# Patient Record
Sex: Female | Born: 1960 | Race: White | Hispanic: No | Marital: Single | Smoking: Former smoker
Health system: Southern US, Community
[De-identification: ages and names within clinical notes are randomized; demographics above are authoritative.]

## PROBLEM LIST (undated history)

## (undated) DIAGNOSIS — C539 Malignant neoplasm of cervix uteri, unspecified: Secondary | ICD-10-CM

## (undated) DIAGNOSIS — G2581 Restless legs syndrome: Secondary | ICD-10-CM

## (undated) DIAGNOSIS — T8859XA Other complications of anesthesia, initial encounter: Secondary | ICD-10-CM

## (undated) DIAGNOSIS — K219 Gastro-esophageal reflux disease without esophagitis: Secondary | ICD-10-CM

## (undated) DIAGNOSIS — T4145XA Adverse effect of unspecified anesthetic, initial encounter: Secondary | ICD-10-CM

## (undated) HISTORY — DX: Gastro-esophageal reflux disease without esophagitis: K21.9

## (undated) HISTORY — PX: COLONOSCOPY WITH ESOPHAGOGASTRODUODENOSCOPY (EGD) AND ESOPHAGEAL DILATION (ED): SHX6495

---
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
## 2001-04-18 DIAGNOSIS — C539 Malignant neoplasm of cervix uteri, unspecified: Secondary | ICD-10-CM

## 2001-04-18 HISTORY — PX: ABDOMINAL HYSTERECTOMY: SHX81

## 2001-04-18 HISTORY — DX: Malignant neoplasm of cervix uteri, unspecified: C53.9

## 2008-11-18 ENCOUNTER — Ambulatory Visit: Payer: Self-pay

## 2010-02-03 ENCOUNTER — Ambulatory Visit: Payer: Self-pay

## 2011-02-18 ENCOUNTER — Ambulatory Visit: Payer: Self-pay

## 2012-01-27 ENCOUNTER — Ambulatory Visit: Payer: Self-pay | Admitting: Gastroenterology

## 2012-03-07 ENCOUNTER — Ambulatory Visit: Payer: Self-pay

## 2013-01-16 LAB — HM PAP SMEAR: HM PAP: NORMAL

## 2013-03-08 ENCOUNTER — Ambulatory Visit: Payer: Self-pay

## 2014-01-16 LAB — HM COLONOSCOPY

## 2014-02-16 LAB — HM MAMMOGRAPHY: HM Mammogram: NORMAL

## 2014-02-25 DIAGNOSIS — R1013 Epigastric pain: Secondary | ICD-10-CM | POA: Insufficient documentation

## 2014-03-07 ENCOUNTER — Ambulatory Visit: Payer: Self-pay | Admitting: Gastroenterology

## 2014-03-18 ENCOUNTER — Ambulatory Visit: Payer: Self-pay

## 2014-08-11 LAB — SURGICAL PATHOLOGY

## 2014-12-18 ENCOUNTER — Ambulatory Visit (INDEPENDENT_AMBULATORY_CARE_PROVIDER_SITE_OTHER): Payer: Managed Care, Other (non HMO) | Admitting: Obstetrics and Gynecology

## 2014-12-18 ENCOUNTER — Encounter: Payer: Self-pay | Admitting: Obstetrics and Gynecology

## 2014-12-18 ENCOUNTER — Other Ambulatory Visit: Payer: Self-pay | Admitting: Obstetrics and Gynecology

## 2014-12-18 ENCOUNTER — Telehealth: Payer: Self-pay | Admitting: Obstetrics and Gynecology

## 2014-12-18 VITALS — BP 116/75 | HR 72 | Ht 62.0 in | Wt 211.3 lb

## 2014-12-18 DIAGNOSIS — Z0000 Encounter for general adult medical examination without abnormal findings: Secondary | ICD-10-CM

## 2014-12-18 DIAGNOSIS — E669 Obesity, unspecified: Secondary | ICD-10-CM

## 2014-12-18 MED ORDER — PHENTERMINE HCL 37.5 MG PO TABS: 37.5000 mg | ORAL_TABLET | Freq: Every day | ORAL | Status: DC

## 2014-12-18 MED ORDER — CYANOCOBALAMIN 1000 MCG/ML IJ SOLN: 1000.0000 ug | Freq: Once | INTRAMUSCULAR | Status: DC

## 2014-12-18 NOTE — Progress Notes (Signed)
Patient ID: Kristin Brown, female   DOB: Dec 14, 1960, 54 y.o.   MRN: [id number] Subjective:  Kristin Brown is a 54 y.o. No obstetric history on file. at Unknown being seen today for weight loss management- initial visit.  Patient reports General ROS: negative and reports previous weight loss attempts with exercise only successful.  Onset was gradual over 2 year(s) ago.  Previous/Current treatment includes: small frequent feedings, nutritional supplement, vitamin supplement, and exercise with walking.  Pertinent medical history includes: chronic digestive disease, diabetes, eating disorder, anxiety and psychiatric illness.   The patient has a surgical history of: hysterectomy.   The following portions of the patient's history were reviewed and updated as appropriate: allergies, current medications, past family history, past medical history, past social history, past surgical history and problem list.   Objective:   Filed Vitals:   12/18/14 1407  BP: 116/75  Pulse: 72  Height: 5\' 2"  (1.575 m)  Weight: 211 lb 4.8 oz (95.845 kg)    General:  Alert, oriented and cooperative. Patient is in no acute distress.  :   :   :   :   :   :   PE: Well groomed female in no current distress,   Mental Status: Normal mood and affect. Normal behavior. Normal judgment and thought content.   Current BMI: Body mass index is 38.64 kg/(m^2).   Assessment and Plan:  Obesity  1. Obesity Secondary to overeating and sedentary lifestyle   Plan: low carb, High protein diet RX for adipex 37.5 mg daily and B12 1072mcg.ml monthly, to start now with first injection given at today's visit. Reviewed side-effects common to both medications and expected outcomes. Increase daily water intake to at least 8 bottle a day, every day.  Goal is to reduse weight by 10% by end of three months, and will re-evaluate then.  RTC in 4 weeks for Nurse visit to check weight & BP, and get next B12  injections.    Please refer to After Visit Summary for other counseling recommendations.    Salik Grewell Valene Bors, CNM   Olena Willy Valene Bors, CNM      Consider the Low Glycemic Index Diet and 6 smaller meals daily .  This boosts your metabolism and regulates your sugars:   Use the protein bar by Atkins because they have lots of fiber in them  Find the low carb flatbreads, tortillas and pita breads for sandwiches:  Joseph's makes a pita bread and a flat bread , available at [facility] and [employer]; Charles Mix makes a low carb flatbread available at [employer] and HT that is 9 net carbs and 100 cal Mission makes a low carb whole wheat tortilla available at [employer] most grocery stores with 6 net carbs and 210 cal  [place] yogurt can still have a lot of carbs .  Dannon Light N fit has 80 cal and 8 carbs

## 2014-12-18 NOTE — Patient Instructions (Signed)
Thank you for enrolling in [facility]. Please follow the instructions below to securely access your online medical record. MyChart allows you to send messages to your doctor, view your test results, renew your prescriptions, schedule appointments, and more.  How Do I Sign Up? 1. In your Internet browser, go to [email or web address]. 2. Click on the New  User? link in the Sign In box.  3. Enter your MyChart Access Code exactly as it appears below. You will not need to use this code after you have completed the sign-up process. If you do not sign up before the expiration date, you must request a new code. MyChart Access Code: [email or web address] Expires: 02/16/2015  2:45 PM  4. Enter the last four digits of your Social Security Number (xxxx) and Date of Birth (mm/dd/yyyy) as indicated and click Next. You will be taken to the next sign-up page. 5. Create a MyChart ID. This will be your MyChart login ID and cannot be changed, so think of one that is secure and easy to remember. 6. Create a MyChart password. You can change your password at any time. 7. Enter your Password Reset Question and Answer and click Next. This can be used at a later time if you forget your password.  8. Select your communication preference, and if applicable enter your e-mail address. You will receive e-mail notification when new information is available in MyChart by choosing to receive e-mail notifications and filling in your e-mail. 9. Click Sign In. You can now view your medical record.   Additional Information If you have questions, you can email [email or web address] or call [phone] to talk to our [facility] staff. Remember, MyChart is NOT to be used for urgent needs. For medical emergencies, dial 911. Obesity Obesity is defined as having too much total body fat and a body mass index (BMI) of 30 or more. BMI is an estimate of body fat and is calculated from your height and weight. Obesity happens  when you consume more calories than you can burn by exercising or performing daily physical tasks. Prolonged obesity can cause major illnesses or emergencies, such as:   Stroke.  Heart disease.  Diabetes.  Cancer.  Arthritis.  High blood pressure (hypertension).  High cholesterol.  Sleep apnea.  Erectile dysfunction.  Infertility problems. CAUSES   Regularly eating unhealthy foods.  Physical inactivity.  Certain disorders, such as an underactive thyroid (hypothyroidism), Cushing's syndrome, and polycystic ovarian syndrome.  Certain medicines, such as steroids, some depression medicines, and antipsychotics.  Genetics.  Lack of sleep. DIAGNOSIS  A health care provider can diagnose obesity after calculating your BMI. Obesity will be diagnosed if your BMI is 30 or higher.  There are other methods of measuring obesity levels. Some other methods include measuring your skinfold thickness, your waist circumference, and comparing your hip circumference to your waist circumference. TREATMENT  A healthy treatment program includes some or all of the following:  Long-term dietary changes.  Exercise and physical activity.  Behavioral and lifestyle changes.  Medicine only under the supervision of your health care provider. Medicines may help, but only if they are used with diet and exercise programs. An unhealthy treatment program includes:  Fasting.  Fad diets.  Supplements and drugs. These choices do not succeed in long-term weight control.  HOME CARE INSTRUCTIONS   Exercise and perform physical activity as directed by your health care provider. To increase physical activity, try the following:  Use stairs instead of elevators.  Park farther  away from store entrances.  Garden, bike, or walk instead of watching television or using the computer.  Eat healthy, low-calorie foods and drinks on a regular basis. Eat more fruits and vegetables. Use low-calorie cookbooks or  take healthy cooking classes.  Limit fast food, sweets, and processed snack foods.  Eat smaller portions.  Keep a daily journal of everything you eat. There are many free websites to help you with this. It may be helpful to measure your foods so you can determine if you are eating the correct portion sizes.  Avoid drinking alcohol. Drink more water and drinks without calories.  Take vitamins and supplements only as recommended by your health care provider.  Weight-loss support groups, Tax adviser, counselors, and stress reduction education can also be very helpful. SEEK IMMEDIATE MEDICAL CARE IF:  You have chest pain or tightness.  You have trouble breathing or feel short of breath.  You have weakness or leg numbness.  You feel confused or have trouble talking.  You have sudden changes in your vision. MAKE SURE YOU:  Understand these instructions.  Will watch your condition.  Will get help right away if you are not doing well or get worse. Document Released: 05/12/2004 Document Revised: 08/19/2013 Document Reviewed: 05/11/2011 [facility] Patient Information 2015 [place], [place]. This information is not intended to replace advice given to you by your health care provider. Make sure you discuss any questions you have with your health care provider.  Exercise to Lose Weight Exercise and a healthy diet may help you lose weight. Your doctor may suggest specific exercises. EXERCISE IDEAS AND TIPS  Choose low-cost things you enjoy doing, such as walking, bicycling, or exercising to workout videos.  Take stairs instead of the elevator.  Walk during your lunch break.  Park your car further away from work or school.  Go to a gym or an exercise class.  Start with 5 to 10 minutes of exercise each day. Build up to 30 minutes of exercise 4 to 6 days a week.  Wear shoes with good support and comfortable clothes.  Stretch before and after working out.  Work out until you  breathe harder and your heart beats faster.  Drink extra water when you exercise.  Do not do so much that you hurt yourself, feel dizzy, or get very short of breath. Exercises that burn about 150 calories:  Running 1  miles in 15 minutes.  Playing volleyball for 45 to 60 minutes.  Washing and waxing a car for 45 to 60 minutes.  Playing touch football for 45 minutes.  Walking 1  miles in 35 minutes.  Pushing a stroller 1  miles in 30 minutes.  Playing basketball for 30 minutes.  Raking leaves for 30 minutes.  Bicycling 5 miles in 30 minutes.  Walking 2 miles in 30 minutes.  Dancing for 30 minutes.  Shoveling snow for 15 minutes.  Swimming laps for 20 minutes.  Walking up stairs for 15 minutes.  Bicycling 4 miles in 15 minutes.  Gardening for 30 to 45 minutes.  Jumping rope for 15 minutes.  Washing windows or floors for 45 to 60 minutes. Document Released: 05/07/2010 Document Revised: 06/27/2011 Document Reviewed: 05/07/2010 [facility] Patient Information 2015 [place], [place]. This information is not intended to replace advice given to you by your health care provider. Make sure you discuss any questions you have with your health care provider. Calorie Counting for Weight Loss Calories are energy you get from the things you eat and drink.  Your body uses this energy to keep you going throughout the day. The number of calories you eat affects your weight. When you eat more calories than your body needs, your body stores the extra calories as fat. When you eat fewer calories than your body needs, your body burns fat to get the energy it needs. Calorie counting means keeping track of how many calories you eat and drink each day. If you make sure to eat fewer calories than your body needs, you should lose weight. In order for calorie counting to work, you will need to eat the number of calories that are right for you in a day to lose a healthy amount of weight per week. A  healthy amount of weight to lose per week is usually 1-2 lb (0.5-0.9 kg). A dietitian can determine how many calories you need in a day and give you suggestions on how to reach your calorie goal.  WHAT IS MY MY PLAN? My goal is to have __________ calories per day.  If I have this many calories per day, I should lose around __________ pounds per week. WHAT DO I NEED TO KNOW ABOUT CALORIE COUNTING? In order to meet your daily calorie goal, you will need to:  Find out how many calories are in each food you would like to eat. Try to do this before you eat.  Decide how much of the food you can eat.  Write down what you ate and how many calories it had. Doing this is called keeping a food log. WHERE DO I FIND CALORIE INFORMATION? The number of calories in a food can be found on a Nutrition Facts label. Note that all the information on a label is based on a specific serving of the food. If a food does not have a Nutrition Facts label, try to look up the calories online or ask your dietitian for help. HOW DO I DECIDE HOW MUCH TO EAT? To decide how much of the food you can eat, you will need to consider both the number of calories in one serving and the size of one serving. This information can be found on the Nutrition Facts label. If a food does not have a Nutrition Facts label, look up the information online or ask your dietitian for help. Remember that calories are listed per serving. If you choose to have more than one serving of a food, you will have to multiply the calories per serving by the amount of servings you plan to eat. For example, the label on a package of bread might say that a serving size is 1 slice and that there are 90 calories in a serving. If you eat 1 slice, you will have eaten 90 calories. If you eat 2 slices, you will have eaten 180 calories. HOW DO I KEEP A FOOD LOG? After each meal, record the following information in your food log:  What you ate.  How much of it you  ate.  How many calories it had.  Then, add up your calories. Keep your food log near you, such as in a small notebook in your pocket. Another option is to use a mobile app or website. Some programs will calculate calories for you and show you how many calories you have left each time you add an item to the log. WHAT ARE SOME CALORIE COUNTING TIPS?  Use your calories on foods and drinks that will fill you up and not leave you hungry. Some examples of this  include foods like nuts and nut butters, vegetables, lean proteins, and high-fiber foods (more than 5 g fiber per serving).  Eat nutritious foods and avoid empty calories. Empty calories are calories you get from foods or beverages that do not have many nutrients, such as candy and soda. It is better to have a nutritious high-calorie food (such as an avocado) than a food with few nutrients (such as a bag of chips).  Know how many calories are in the foods you eat most often. This way, you do not have to look up how many calories they have each time you eat them.  Look out for foods that may seem like low-calorie foods but are really high-calorie foods, such as baked goods, soda, and fat-free candy.  Pay attention to calories in drinks. Drinks such as sodas, specialty coffee drinks, alcohol, and juices have a lot of calories yet do not fill you up. Choose low-calorie drinks like water and diet drinks.  Focus your calorie counting efforts on higher calorie items. Logging the calories in a garden salad that contains only vegetables is less important than calculating the calories in a milk shake.  Find a way of tracking calories that works for you. Get creative. Most people who are successful find ways to keep track of how much they eat in a day, even if they do not count every calorie. WHAT ARE SOME PORTION CONTROL TIPS?  Know how many calories are in a serving. This will help you know how many servings of a certain food you can have.  Use a  measuring cup to measure serving sizes. This is helpful when you start out. With time, you will be able to estimate serving sizes for some foods.  Take some time to put servings of different foods on your favorite plates, bowls, and cups so you know what a serving looks like.  Try not to eat straight from a bag or box. Doing this can lead to overeating. Put the amount you would like to eat in a cup or on a plate to make sure you are eating the right portion.  Use smaller plates, glasses, and bowls to prevent overeating. This is a quick and easy way to practice portion control. If your plate is smaller, less food can fit on it.  Try not to multitask while eating, such as watching TV or using your computer. If it is time to eat, sit down at a table and enjoy your food. Doing this will help you to start recognizing when you are full. It will also make you more aware of what and how much you are eating. HOW CAN I CALORIE COUNT WHEN EATING OUT?  Ask for smaller portion sizes or child-sized portions.  Consider sharing an entree and sides instead of getting your own entree.  If you get your own entree, eat only half. Ask for a box at the beginning of your meal and put the rest of your entree in it so you are not tempted to eat it.  Look for the calories on the menu. If calories are listed, choose the lower calorie options.  Choose dishes that include vegetables, fruits, whole grains, low-fat dairy products, and lean protein. Focusing on smart food choices from each of the 5 food groups can help you stay on track at restaurants.  Choose items that are boiled, broiled, grilled, or steamed.  Choose water, milk, unsweetened iced tea, or other drinks without added sugars. If you want an alcoholic beverage,  choose a lower calorie option. For example, a regular margarita can have up to 700 calories and a glass of wine has around 150.  Stay away from items that are buttered, battered, fried, or served with  cream sauce. Items labeled "crispy" are usually fried, unless stated otherwise.  Ask for dressings, sauces, and syrups on the side. These are usually very high in calories, so do not eat much of them.  Watch out for salads. Many people think salads are a healthy option, but this is often not the case. Many salads come with bacon, fried chicken, lots of cheese, fried chips, and dressing. All of these items have a lot of calories. If you want a salad, choose a garden salad and ask for grilled meats or steak. Ask for the dressing on the side, or ask for olive oil and vinegar or lemon to use as dressing.  Estimate how many servings of a food you are given. For example, a serving of cooked rice is  cup or about the size of half a tennis ball or one cupcake wrapper. Knowing serving sizes will help you be aware of how much food you are eating at restaurants. The list below tells you how big or small some common portion sizes are based on everyday objects.  1 oz--4 stacked dice.  3 oz--1 deck of cards.  1 tsp--1 dice.  1 Tbsp-- a Ping-Pong ball.  2 Tbsp--1 Ping-Pong ball.   cup--1 tennis ball or 1 cupcake wrapper.  1 cup--1 baseball. Document Released: 04/04/2005 Document Revised: 08/19/2013 Document Reviewed: 02/07/2013 [facility] Patient Information 2015 [place], [place]. This information is not intended to replace advice given to you by your health care provider. Make sure you discuss any questions you have with your health care provider.

## 2014-12-18 NOTE — Telephone Encounter (Signed)
done

## 2014-12-18 NOTE — Telephone Encounter (Signed)
Can you put in a mammogram appt for this pt to go to [place]. She wants to call and schedule for November.

## 2015-01-15 ENCOUNTER — Ambulatory Visit (INDEPENDENT_AMBULATORY_CARE_PROVIDER_SITE_OTHER): Payer: Managed Care, Other (non HMO) | Admitting: Obstetrics and Gynecology

## 2015-01-15 VITALS — BP 107/67 | HR 83 | Wt 198.7 lb

## 2015-01-15 DIAGNOSIS — E669 Obesity, unspecified: Secondary | ICD-10-CM

## 2015-01-15 MED ORDER — CYANOCOBALAMIN 1000 MCG/ML IJ SOLN
1000.0000 ug | Freq: Once | INTRAMUSCULAR | Status: AC
  Administered 2015-01-15: 1000 ug via INTRAMUSCULAR

## 2015-01-15 NOTE — Progress Notes (Cosign Needed)
Pt is here for wt, bp check, b-12 inj She has done excellent with her weight loss, denies any s/e  12/18/14 wt 211 01/14/15 wt- 198.7

## 2015-02-12 ENCOUNTER — Ambulatory Visit (INDEPENDENT_AMBULATORY_CARE_PROVIDER_SITE_OTHER): Payer: Managed Care, Other (non HMO) | Admitting: Obstetrics and Gynecology

## 2015-02-12 VITALS — BP 115/78 | HR 71 | Ht 62.0 in | Wt 192.2 lb

## 2015-02-12 DIAGNOSIS — E669 Obesity, unspecified: Secondary | ICD-10-CM | POA: Diagnosis not present

## 2015-02-12 MED ORDER — CYANOCOBALAMIN 1000 MCG/ML IJ SOLN
1000.0000 ug | Freq: Once | INTRAMUSCULAR | Status: AC
  Administered 2015-02-12: 1000 ug via INTRAMUSCULAR

## 2015-02-12 NOTE — Progress Notes (Cosign Needed)
Patient ID: Kristin Brown, female   DOB: [birth year], 54 y.o.   MRN: [id number] Pt presents for weight, B/P, B-12 injection. No side effects of medication-Phentermine, or B-12.  Weight loss/gain of  6  lbs. Encouraged eating healthy and exercise.

## 2015-03-04 ENCOUNTER — Ambulatory Visit (INDEPENDENT_AMBULATORY_CARE_PROVIDER_SITE_OTHER): Payer: Managed Care, Other (non HMO) | Admitting: Obstetrics and Gynecology

## 2015-03-04 ENCOUNTER — Encounter: Payer: Self-pay | Admitting: Obstetrics and Gynecology

## 2015-03-04 VITALS — BP 113/77 | HR 66 | Ht 62.0 in | Wt 189.1 lb

## 2015-03-04 DIAGNOSIS — Z8543 Personal history of malignant neoplasm of ovary: Secondary | ICD-10-CM | POA: Diagnosis not present

## 2015-03-04 DIAGNOSIS — Z01419 Encounter for gynecological examination (general) (routine) without abnormal findings: Secondary | ICD-10-CM | POA: Diagnosis not present

## 2015-03-04 NOTE — Progress Notes (Signed)
Patient ID: Kristin Brown, female   DOB: February 16, 1961, 54 y.o.   MRN: [id number]  Subjective:   Kristin Brown is a 54 y.o. No obstetric history on file. Caucasian female here for a routine well-woman exam.  No LMP recorded. Patient has had a hysterectomy.    Current complaints: none PCP: unknown       Does need &  desire labs  Social History: Sexual: heterosexual Marital Status: married Living situation: with spouse Occupation: works at [employer] Tobacco/alcohol: no tobacco use Illicit drugs: no history of illicit drug use  The following portions of the patient's history were reviewed and updated as appropriate: allergies, current medications, past family history, past medical history, past social history, past surgical history and problem list.  Past Medical History Past Medical History  Diagnosis Date  . GERD (gastroesophageal reflux disease)     Past Surgical History Past Surgical History  Procedure Laterality Date  . Abdominal hysterectomy  2003    Gynecologic History No obstetric history on file.  No LMP recorded. Patient has had a hysterectomy. Contraception: status post hysterectomy Last Pap: 2014. Results were: normal Last mammogram: 2015. Results were: normal   Obstetric History OB History  No data available    Current Medications Current Outpatient Prescriptions on File Prior to Visit  Medication Sig Dispense Refill  . cyanocobalamin (,VITAMIN B-12,) 1000 MCG/ML injection Inject 1 mL (1,000 mcg total) into the muscle once. 10 mL 1  . pantoprazole (PROTONIX) 40 MG tablet Take 40 mg by mouth daily.    . phentermine (ADIPEX-P) 37.5 MG tablet Take 1 tablet (37.5 mg total) by mouth daily before breakfast. 30 tablet 2   No current facility-administered medications on file prior to visit.    Review of Systems Patient denies any headaches, blurred vision, shortness of breath, chest pain, abdominal pain, problems with bowel movements, urination, or  intercourse.  Objective:  BP 113/77 mmHg  Pulse 66  Ht 5\' 2"  (1.575 m)  Wt 189 lb 2 oz (85.787 kg)  BMI 34.58 kg/m2 Physical Exam  General:  Well developed, well nourished, no acute distress. She is alert and oriented x3. Skin:  Warm and dry Neck:  Midline trachea, no thyromegaly or nodules Cardiovascular: Regular rate and rhythm, no murmur heard Lungs:  Effort normal, all lung fields clear to auscultation bilaterally Breasts:  No dominant palpable mass, retraction, or nipple discharge Abdomen:  Soft, non tender, no hepatosplenomegaly or masses Pelvic:  External genitalia is normal in appearance.  The vagina is normal in appearance. The cervix is surgically absent.  Thin prep pap is not done   No adnexal masses or tenderness noted. Extremities:  No swelling or varicosities noted Psych:  She has a normal mood and affect  Assessment:   Healthy well-woman exam; obesity on weight loss medications and doing well; H/O ovarian cancer- desires MyRisk  Plan:  Labs obtained today F/U 1 year for AE, or sooner if needed Mammogram scheduled Colonoscopy scheduled already  Kristin Brown, CNM

## 2015-03-04 NOTE — Patient Instructions (Signed)
Place annual gynecologic exam patient instructions here.

## 2015-03-05 LAB — COMPREHENSIVE METABOLIC PANEL
ALT: 11 IU/L (ref 0–32)
AST: 16 IU/L (ref 0–40)
Albumin/Globulin Ratio: 1.6 (ref 1.1–2.5)
Albumin: 4.5 g/dL (ref 3.5–5.5)
Alkaline Phosphatase: 47 IU/L (ref 39–117)
BUN/Creatinine Ratio: 10 (ref 9–23)
BUN: 8 mg/dL (ref 6–24)
Bilirubin Total: 0.3 mg/dL (ref 0.0–1.2)
CO2: 26 mmol/L (ref 18–29)
Calcium: 9.9 mg/dL (ref 8.7–10.2)
Chloride: 99 mmol/L (ref 97–106)
Creatinine, Ser: 0.79 mg/dL (ref 0.57–1.00)
GFR calc Af Amer: 99 mL/min/{1.73_m2} (ref >59)
GFR calc non Af Amer: 86 mL/min/{1.73_m2} (ref >59)
Globulin, Total: 2.8 g/dL (ref 1.5–4.5)
Glucose: 93 mg/dL (ref 65–99)
Potassium: 4.1 mmol/L (ref 3.5–5.2)
Sodium: 139 mmol/L (ref 136–144)
Total Protein: 7.3 g/dL (ref 6.0–8.5)

## 2015-03-05 LAB — HEMOGLOBIN A1C
Est. average glucose Bld gHb Est-mCnc: 114 mg/dL
Hgb A1c MFr Bld: 5.6 % (ref 4.8–5.6)

## 2015-03-05 LAB — LIPID PANEL
Chol/HDL Ratio: 3.3 ratio units (ref 0.0–4.4)
Cholesterol, Total: 194 mg/dL (ref 100–199)
HDL: 58 mg/dL (ref >39)
LDL Calculated: 103 mg/dL — ABNORMAL HIGH (ref 0–99)
TRIGLYCERIDES: 163 mg/dL — AB (ref 0–149)
VLDL CHOLESTEROL CAL: 33 mg/dL (ref 5–40)

## 2015-03-05 LAB — TSH: TSH: 1.61 u[IU]/mL (ref 0.450–4.500)

## 2015-03-11 ENCOUNTER — Ambulatory Visit: Payer: Managed Care, Other (non HMO)

## 2015-03-17 ENCOUNTER — Ambulatory Visit (INDEPENDENT_AMBULATORY_CARE_PROVIDER_SITE_OTHER): Payer: Managed Care, Other (non HMO) | Admitting: Obstetrics and Gynecology

## 2015-03-17 VITALS — BP 114/77 | HR 89 | Wt 190.9 lb

## 2015-03-17 DIAGNOSIS — E669 Obesity, unspecified: Secondary | ICD-10-CM | POA: Diagnosis not present

## 2015-03-17 MED ORDER — CYANOCOBALAMIN 1000 MCG/ML IJ SOLN
1000.0000 ug | Freq: Once | INTRAMUSCULAR | Status: AC
  Administered 2015-03-17: 1000 ug via INTRAMUSCULAR

## 2015-03-25 ENCOUNTER — Ambulatory Visit (INDEPENDENT_AMBULATORY_CARE_PROVIDER_SITE_OTHER): Payer: Managed Care, Other (non HMO) | Admitting: Obstetrics and Gynecology

## 2015-03-25 ENCOUNTER — Encounter: Payer: Self-pay | Admitting: Obstetrics and Gynecology

## 2015-03-25 VITALS — BP 111/77 | HR 83 | Ht 62.0 in | Wt 188.9 lb

## 2015-03-25 DIAGNOSIS — E669 Obesity, unspecified: Secondary | ICD-10-CM

## 2015-03-25 MED ORDER — CYANOCOBALAMIN 1000 MCG/ML IJ SOLN: 1000.0000 ug | Freq: Once | INTRAMUSCULAR | Status: DC

## 2015-03-25 MED ORDER — PHENTERMINE HCL 37.5 MG PO TABS
37.5000 mg | ORAL_TABLET | Freq: Every day | ORAL | Status: DC
Start: 2015-03-25 — End: 2015-07-30

## 2015-03-25 NOTE — Progress Notes (Signed)
Patient ID: Kristin Brown, female   DOB: 01-14-1961, 54 y.o.   MRN: [id number] SUBJECTIVE:  54 y.o. here for follow-up weight loss visit, previously seen 4 weeks ago. Denies any concerns and feels like medication worked well, but wore off early evening. Has been off medication x 8 days, desires continuing.  OBJECTIVE:  BP 111/77 mmHg  Pulse 83  Ht 5\' 2"  (1.575 m)  Wt 188 lb 14.4 oz (85.684 kg)  BMI 34.54 kg/m2  Body mass index is 34.54 kg/(m^2). Patient appears well. Wt loss to date 24 # ASSESSMENT:  Obesity- responding well to weight loss plan PLAN:  To continue with current medications. B12 1060mcg/ml injection given last week RTC in 5 weeks as planned, refills given.  Jillianne Gamino [place], CNM

## 2015-04-07 ENCOUNTER — Other Ambulatory Visit: Payer: Self-pay | Admitting: Obstetrics and Gynecology

## 2015-04-28 ENCOUNTER — Ambulatory Visit: Payer: Managed Care, Other (non HMO)

## 2015-04-28 ENCOUNTER — Encounter: Payer: Self-pay | Admitting: Obstetrics and Gynecology

## 2015-04-30 ENCOUNTER — Ambulatory Visit (INDEPENDENT_AMBULATORY_CARE_PROVIDER_SITE_OTHER): Payer: Managed Care, Other (non HMO) | Admitting: Obstetrics and Gynecology

## 2015-04-30 VITALS — BP 118/80 | HR 89 | Wt 185.6 lb

## 2015-04-30 DIAGNOSIS — E669 Obesity, unspecified: Secondary | ICD-10-CM | POA: Diagnosis not present

## 2015-04-30 MED ORDER — CYANOCOBALAMIN 1000 MCG/ML IJ SOLN
1000.0000 ug | Freq: Once | INTRAMUSCULAR | Status: AC
  Administered 2015-04-30: 1000 ug via INTRAMUSCULAR

## 2015-04-30 NOTE — Progress Notes (Cosign Needed)
Patient ID: Kristin Brown, female   DOB: 11/15/1960, 55 y.o.   MRN: [id number] Pt presents for weight, B/P, B-12 injection. No side effects of medication-Phentermine, or B-12.  Weight loss of 3 lbs. Encouraged eating healthy and exercise.

## 2015-05-28 ENCOUNTER — Ambulatory Visit (INDEPENDENT_AMBULATORY_CARE_PROVIDER_SITE_OTHER): Payer: Managed Care, Other (non HMO) | Admitting: Obstetrics and Gynecology

## 2015-05-28 VITALS — BP 116/67 | HR 83 | Wt 183.5 lb

## 2015-05-28 DIAGNOSIS — E669 Obesity, unspecified: Secondary | ICD-10-CM | POA: Diagnosis not present

## 2015-05-28 MED ORDER — CYANOCOBALAMIN 1000 MCG/ML IJ SOLN
1000.0000 ug | Freq: Once | INTRAMUSCULAR | Status: AC
  Administered 2015-05-28: 1000 ug via INTRAMUSCULAR

## 2015-05-28 NOTE — Progress Notes (Cosign Needed)
Patient ID: Kristin Brown, female   DOB: Sep 15, 1960, 55 y.o.   MRN: [id number] Pt presents for weight, B/P, B-12 injection. No side effects of medication-Phentermine, or B-12.  Weight loss of 2 lbs. Encouraged eating healthy and exercise.

## 2015-06-26 ENCOUNTER — Ambulatory Visit (INDEPENDENT_AMBULATORY_CARE_PROVIDER_SITE_OTHER): Payer: Managed Care, Other (non HMO) | Admitting: Obstetrics and Gynecology

## 2015-06-26 VITALS — BP 125/75 | HR 77 | Wt 180.8 lb

## 2015-06-26 DIAGNOSIS — E669 Obesity, unspecified: Secondary | ICD-10-CM | POA: Diagnosis not present

## 2015-06-26 MED ORDER — CYANOCOBALAMIN 1000 MCG/ML IJ SOLN
1000.0000 ug | Freq: Once | INTRAMUSCULAR | Status: AC
  Administered 2015-06-26: 1000 ug via INTRAMUSCULAR

## 2015-06-26 NOTE — Progress Notes (Signed)
Pt is here for wt, bp check, b-12 inj She denies any s/e    03/25/15 wt-188lb 04/30/15 wt-185lb 05/28/15 wt-183lb

## 2015-07-30 ENCOUNTER — Ambulatory Visit (INDEPENDENT_AMBULATORY_CARE_PROVIDER_SITE_OTHER): Payer: Managed Care, Other (non HMO) | Admitting: Obstetrics and Gynecology

## 2015-07-30 ENCOUNTER — Encounter: Payer: Self-pay | Admitting: Obstetrics and Gynecology

## 2015-07-30 VITALS — BP 115/72 | HR 86 | Wt 179.0 lb

## 2015-07-30 DIAGNOSIS — K2100 Gastro-esophageal reflux disease with esophagitis, without bleeding: Secondary | ICD-10-CM

## 2015-07-30 DIAGNOSIS — N951 Menopausal and female climacteric states: Secondary | ICD-10-CM

## 2015-07-30 DIAGNOSIS — E669 Obesity, unspecified: Secondary | ICD-10-CM | POA: Diagnosis not present

## 2015-07-30 MED ORDER — PHENTERMINE HCL 37.5 MG PO TABS: 37.5000 mg | ORAL_TABLET | Freq: Every day | ORAL | Status: DC

## 2015-07-30 MED ORDER — CYANOCOBALAMIN 1000 MCG/ML IJ SOLN: 1000.0000 ug | Freq: Once | INTRAMUSCULAR | Status: DC

## 2015-07-30 MED ORDER — BLACK COHOSH 200 MG PO CAPS: 1.0000 | ORAL_CAPSULE | Freq: Two times a day (BID) | ORAL | Status: DC

## 2015-07-30 NOTE — Progress Notes (Signed)
SUBJECTIVE:  55 y.o. here for follow-up weight loss visit, previously seen 4 weeks ago. Denies any concerns and feels like medication has worked well, has lost another 10#, making it 32 # total since September. Desires to continue. Does reports onset of hot flashes in last few months, day and night.  OBJECTIVE:  BP 115/72 mmHg  Pulse 86  Wt 179 lb (81.194 kg)  Body mass index is 32.73 kg/(m^2). Patient appears well. ASSESSMENT:  Obesity- responding well to weight loss plan Hot flashes PLAN:  To continue with current medications. B12 1036mcg/ml injection given RTC in 4 weeks as planned RX refilled. Recommend trying black cohosh bid prn for hot flashes. Melody [place], CNM

## 2015-08-20 ENCOUNTER — Other Ambulatory Visit: Payer: Self-pay | Admitting: Family Medicine

## 2015-08-20 DIAGNOSIS — Z1231 Encounter for screening mammogram for malignant neoplasm of breast: Secondary | ICD-10-CM

## 2015-08-27 ENCOUNTER — Ambulatory Visit (INDEPENDENT_AMBULATORY_CARE_PROVIDER_SITE_OTHER): Payer: Managed Care, Other (non HMO) | Admitting: Obstetrics and Gynecology

## 2015-08-27 VITALS — BP 109/76 | HR 81 | Wt 178.1 lb

## 2015-08-27 DIAGNOSIS — E669 Obesity, unspecified: Secondary | ICD-10-CM

## 2015-08-27 MED ORDER — CYANOCOBALAMIN 1000 MCG/ML IJ SOLN
1000.0000 ug | Freq: Once | INTRAMUSCULAR | Status: AC
  Administered 2015-08-27: 1000 ug via INTRAMUSCULAR

## 2015-08-27 NOTE — Progress Notes (Signed)
Patient ID: Kristin Brown, female   DOB: 10/17/1960, 55 y.o.   MRN: [id number] Pt presents for weight, B/P, B-12 injection. No side effects of medication-Phentermine, or B-12.  Weight loss of 1 lbs. Encouraged eating healthy and exercise. Pt started on Pramipexole 0.125mg . (generic for mirapex) for restlessness leg syndrome.

## 2015-08-31 ENCOUNTER — Ambulatory Visit
Admission: RE | Admit: 2015-08-31 | Discharge: 2015-08-31 | Disposition: A | Discharge status: Home / Self Care | Payer: Managed Care, Other (non HMO) | Source: Ambulatory Visit | Attending: Family Medicine | Admitting: Family Medicine

## 2015-08-31 DIAGNOSIS — Z1231 Encounter for screening mammogram for malignant neoplasm of breast: Secondary | ICD-10-CM | POA: Insufficient documentation

## 2015-08-31 HISTORY — DX: Malignant neoplasm of cervix uteri, unspecified: C53.9

## 2015-09-24 ENCOUNTER — Ambulatory Visit: Payer: Managed Care, Other (non HMO)

## 2015-09-25 ENCOUNTER — Ambulatory Visit (INDEPENDENT_AMBULATORY_CARE_PROVIDER_SITE_OTHER): Payer: Managed Care, Other (non HMO) | Admitting: Obstetrics and Gynecology

## 2015-09-25 ENCOUNTER — Ambulatory Visit: Payer: Managed Care, Other (non HMO)

## 2015-09-25 VITALS — BP 111/72 | HR 83 | Ht 62.0 in | Wt 177.8 lb

## 2015-09-25 DIAGNOSIS — B373 Candidiasis of vulva and vagina: Secondary | ICD-10-CM

## 2015-09-25 DIAGNOSIS — R5383 Other fatigue: Secondary | ICD-10-CM | POA: Diagnosis not present

## 2015-09-25 DIAGNOSIS — E669 Obesity, unspecified: Secondary | ICD-10-CM

## 2015-09-25 DIAGNOSIS — B3731 Acute candidiasis of vulva and vagina: Secondary | ICD-10-CM

## 2015-09-25 MED ORDER — FLUCONAZOLE 150 MG PO TABS: 150.0000 mg | ORAL_TABLET | Freq: Once | ORAL | Status: DC

## 2015-09-25 MED ORDER — CYANOCOBALAMIN 1000 MCG/ML IJ SOLN
1000.0000 ug | Freq: Once | INTRAMUSCULAR | Status: AC
  Administered 2015-09-25: 1000 ug via INTRAMUSCULAR

## 2015-09-25 NOTE — Patient Instructions (Signed)
Follow up in 4 weeks for weight management

## 2015-09-25 NOTE — Progress Notes (Signed)
Pt presents for weight, B/P, B-12 injection. No side effects of medication-Phentermine, or B-12.  Weight loss of 1 lb. Encouraged eating healthy and exercise. PT given 84mL of B12, which she tolerated well.

## 2015-10-27 ENCOUNTER — Ambulatory Visit (INDEPENDENT_AMBULATORY_CARE_PROVIDER_SITE_OTHER): Payer: Managed Care, Other (non HMO) | Admitting: Obstetrics and Gynecology

## 2015-10-27 VITALS — BP 114/82 | HR 79 | Wt 178.5 lb

## 2015-10-27 DIAGNOSIS — E669 Obesity, unspecified: Secondary | ICD-10-CM | POA: Diagnosis not present

## 2015-10-27 DIAGNOSIS — R5383 Other fatigue: Secondary | ICD-10-CM

## 2015-10-27 MED ORDER — CYANOCOBALAMIN 1000 MCG/ML IJ SOLN
1000.0000 ug | Freq: Once | INTRAMUSCULAR | Status: AC
  Administered 2015-10-27: 1000 ug via INTRAMUSCULAR

## 2015-10-27 NOTE — Progress Notes (Signed)
Patient ID: Kristin Brown, female   DOB: 08/22/1960, 55 y.o.   MRN: [id number] Pt presents for weight, B/P, B-12 injection. No side effects of medication-Phentermine, or B-12.  Weight gain of 1 lbs. Encouraged eating healthy and exercise. Pt just got back from [place] for daughter's graduation.

## 2015-12-08 ENCOUNTER — Ambulatory Visit (INDEPENDENT_AMBULATORY_CARE_PROVIDER_SITE_OTHER): Payer: Managed Care, Other (non HMO) | Admitting: Obstetrics and Gynecology

## 2015-12-08 ENCOUNTER — Encounter: Payer: Self-pay | Admitting: Obstetrics and Gynecology

## 2015-12-08 VITALS — BP 116/72 | HR 75 | Ht 62.0 in | Wt 181.1 lb

## 2015-12-08 DIAGNOSIS — E669 Obesity, unspecified: Secondary | ICD-10-CM | POA: Insufficient documentation

## 2015-12-08 DIAGNOSIS — Z79899 Other long term (current) drug therapy: Secondary | ICD-10-CM | POA: Diagnosis not present

## 2015-12-08 NOTE — Progress Notes (Signed)
-  SUBJECTIVE:  55 y.o. here for follow-up weight loss visit, previously seen 4 weeks ago. Denies any concerns and feels like medication is not working anymore, and states a lot of stress. Has not lost any weight this 3 months. Feels really tired all the time.  OBJECTIVE:  BP 116/72   Pulse 75   Ht 5\' 2"  (1.575 m)   Wt 181 lb 1.6 oz (82.1 kg)   BMI 33.12 kg/m   Body mass index is 33.12 kg/m. Patient appears well. ASSESSMENT:  Obesity- not responding well to weight loss plan PLAN:  Will abort medications other than will give current B12 injection today. Will follow up in November at [place]. Melody Gadsden, CNM

## 2016-05-16 ENCOUNTER — Other Ambulatory Visit
Admission: RE | Admit: 2016-05-16 | Discharge: 2016-05-16 | Disposition: A | Discharge status: Home / Self Care | Payer: Managed Care, Other (non HMO) | Source: Ambulatory Visit | Attending: Obstetrics and Gynecology | Admitting: Obstetrics and Gynecology

## 2016-05-16 ENCOUNTER — Encounter: Payer: Self-pay | Admitting: Obstetrics and Gynecology

## 2016-05-16 ENCOUNTER — Ambulatory Visit (INDEPENDENT_AMBULATORY_CARE_PROVIDER_SITE_OTHER): Payer: Managed Care, Other (non HMO) | Admitting: Obstetrics and Gynecology

## 2016-05-16 ENCOUNTER — Other Ambulatory Visit: Payer: Self-pay | Admitting: Obstetrics and Gynecology

## 2016-05-16 VITALS — BP 131/73 | HR 66 | Ht 62.0 in | Wt 199.8 lb

## 2016-05-16 DIAGNOSIS — N393 Stress incontinence (female) (male): Secondary | ICD-10-CM | POA: Diagnosis not present

## 2016-05-16 DIAGNOSIS — Z01419 Encounter for gynecological examination (general) (routine) without abnormal findings: Secondary | ICD-10-CM | POA: Insufficient documentation

## 2016-05-16 DIAGNOSIS — N762 Acute vulvitis: Secondary | ICD-10-CM

## 2016-05-16 LAB — LIPID PANEL
CHOLESTEROL: 181 mg/dL (ref 0–200)
HDL: 64 mg/dL (ref >40)
LDL CALC: 93 mg/dL (ref 0–99)
Total CHOL/HDL Ratio: 2.8 RATIO
Triglycerides: 122 mg/dL (ref <150)
VLDL: 24 mg/dL (ref 0–40)

## 2016-05-16 LAB — COMPREHENSIVE METABOLIC PANEL
ALK PHOS: 36 U/L — AB (ref 38–126)
ALT: 21 U/L (ref 14–54)
ANION GAP: 7 (ref 5–15)
AST: 24 U/L (ref 15–41)
Albumin: 4.5 g/dL (ref 3.5–5.0)
BILIRUBIN TOTAL: 0.6 mg/dL (ref 0.3–1.2)
BUN: 15 mg/dL (ref 6–20)
CALCIUM: 9.2 mg/dL (ref 8.9–10.3)
CO2: 26 mmol/L (ref 22–32)
CREATININE: 0.76 mg/dL (ref 0.44–1.00)
Chloride: 104 mmol/L (ref 101–111)
GFR calc Af Amer: >60 mL/min (ref >60)
Glucose, Bld: 88 mg/dL (ref 65–99)
Potassium: 3.6 mmol/L (ref 3.5–5.1)
Sodium: 137 mmol/L (ref 135–145)
TOTAL PROTEIN: 7.2 g/dL (ref 6.5–8.1)

## 2016-05-16 MED ORDER — VITAMIN C 1000 MG PO TABS: 1000.0000 mg | ORAL_TABLET | Freq: Every day | ORAL | Status: DC

## 2016-05-16 MED ORDER — BIOTIN 5000 MCG PO CAPS: 1.0000 | ORAL_CAPSULE | Freq: Every day | ORAL | 0 refills | Status: DC

## 2016-05-16 MED ORDER — MAGNESIUM 250 MG PO TABS: 1.0000 | ORAL_TABLET | Freq: Every day | ORAL | 0 refills | Status: DC

## 2016-05-16 MED ORDER — POTASSIUM 99 MG PO TABS: 1.0000 | ORAL_TABLET | Freq: Every day | ORAL | 0 refills | Status: DC

## 2016-05-16 MED ORDER — B-12 2500 MCG PO TABS: 1.0000 | ORAL_TABLET | Freq: Every day | ORAL | 0 refills | Status: DC

## 2016-05-16 NOTE — Progress Notes (Signed)
Subjective:   Sumeya Sise is a 56 y.o. No obstetric history on file. Caucasian female here for a routine well-woman exam.  No LMP recorded. Patient has had a hysterectomy.    Current complaints: none [name]       does desire labs  Social History: Sexual: heterosexual Marital Status: married Living situation: with family Occupation: [employer] Tobacco/alcohol: no tobacco use Illicit drugs: no history of illicit drug use  The following portions of the patient's history were reviewed and updated as appropriate: allergies, current medications, past family history, past medical history, past social history, past surgical history and problem list.  Past Medical History Past Medical History:  Diagnosis Date  . Cervical cancer ([place]) 2003  . GERD (gastroesophageal reflux disease)     Past Surgical History Past Surgical History:  Procedure Laterality Date  . ABDOMINAL HYSTERECTOMY  2003    Gynecologic History No obstetric history on file.  No LMP recorded. Patient has had a hysterectomy. Contraception: tubal ligation Last Pap: 2014. Results were: normal Last mammogram: 2017. Results were: normal   Obstetric History OB History  No data available    Current Medications Current Outpatient Prescriptions on File Prior to Visit  Medication Sig Dispense Refill  . Black Cohosh 200 MG CAPS Take 1 capsule (200 mg total) by mouth 2 (two) times daily. 120 capsule 2  . pantoprazole (PROTONIX) 40 MG tablet Take 40 mg by mouth daily. Reported on 07/30/2015    . pramipexole (MIRAPEX) 0.125 MG tablet Take by mouth.    . cyanocobalamin (,VITAMIN B-12,) 1000 MCG/ML injection Inject 1 mL (1,000 mcg total) into the muscle once. (Patient not taking: Reported on 05/16/2016) 10 mL 1  . FLUARIX QUADRIVALENT 0.5 ML injection Reported on 07/30/2015  0  . fluconazole (DIFLUCAN) 150 MG tablet Take 1 tablet (150 mg total) by mouth once. (Patient not taking: Reported on 12/08/2015) 1 tablet 1  .  phentermine (ADIPEX-P) 37.5 MG tablet Take 1 tablet (37.5 mg total) by mouth daily before breakfast. (Patient not taking: Reported on 12/08/2015) 30 tablet 2   No current facility-administered medications on file prior to visit.     Review of Systems Patient denies any headaches, blurred vision, shortness of breath, chest pain, abdominal pain, problems with bowel movements, urination, or intercourse.  Objective:  BP 131/73   Pulse 66   Ht 5\' 2"  (1.575 m)   Wt 199 lb 12.8 oz (90.6 kg)   BMI 36.54 kg/m  Physical Exam  General:  Well developed, well nourished, no acute distress. She is alert and oriented x3. Skin:  Warm and dry Neck:  Midline trachea, no thyromegaly or nodules Cardiovascular: Regular rate and rhythm, no murmur heard Lungs:  Effort normal, all lung fields clear to auscultation bilaterally Breasts:  No dominant palpable mass, retraction, or nipple discharge Abdomen:  Soft, non tender, no hepatosplenomegaly or masses Pelvic:  External genitalia is normal in appearance.  The vagina is normal in appearance. The cervix is bulbous, no CMT.  Thin prep pap is done with HR HPV cotesting. Uterus is felt to be normal size, shape, and contour.  No adnexal masses or tenderness noted. External vulva red and itchy. Extremities:  No swelling or varicosities noted Psych:  She has a normal mood and affect  Assessment:   Healthy well-woman exam Vulvar irritation from pads  Plan:  triamcilone oint bid as needed F/U 1 year for AE, or sooner if needed Mammogram ordered or sooner if problems   Melody  Rockney Ghee, CNM

## 2016-05-17 ENCOUNTER — Encounter: Payer: Managed Care, Other (non HMO) | Admitting: Obstetrics and Gynecology

## 2016-05-17 LAB — CYTOLOGY - PAP

## 2016-05-17 LAB — HEMOGLOBIN A1C
Hgb A1c MFr Bld: 5.3 % (ref 4.8–5.6)
Mean Plasma Glucose: 105 mg/dL

## 2016-05-17 LAB — VITAMIN D 25 HYDROXY (VIT D DEFICIENCY, FRACTURES): Vit D, 25-Hydroxy: 24.9 ng/mL — ABNORMAL LOW (ref 30.0–100.0)

## 2016-05-18 ENCOUNTER — Other Ambulatory Visit: Payer: Self-pay | Admitting: Obstetrics and Gynecology

## 2016-05-18 ENCOUNTER — Telehealth: Payer: Self-pay | Admitting: *Deleted

## 2016-05-18 MED ORDER — VITAMIN D (ERGOCALCIFEROL) 1.25 MG (50000 UNIT) PO CAPS: 50000.0000 [IU] | ORAL_CAPSULE | ORAL | 1 refills | Status: DC

## 2016-05-18 NOTE — Telephone Encounter (Signed)
-----   Message from Joylene Igo, [place] sent at 05/18/2016  8:44 AM EST ----- Please mail info on vit D

## 2016-05-18 NOTE — Telephone Encounter (Signed)
Mailed info to pt 

## 2016-06-08 ENCOUNTER — Encounter: Payer: Self-pay | Admitting: Obstetrics and Gynecology

## 2016-06-09 ENCOUNTER — Encounter: Payer: Self-pay | Admitting: Obstetrics and Gynecology

## 2016-06-09 ENCOUNTER — Other Ambulatory Visit: Payer: Self-pay | Admitting: Obstetrics and Gynecology

## 2016-06-09 MED ORDER — FLUOXETINE HCL 10 MG PO CAPS: 10.0000 mg | ORAL_CAPSULE | Freq: Every day | ORAL | 3 refills | Status: DC

## 2016-10-03 DIAGNOSIS — G2581 Restless legs syndrome: Secondary | ICD-10-CM | POA: Insufficient documentation

## 2016-10-20 ENCOUNTER — Encounter: Payer: Self-pay | Admitting: Obstetrics and Gynecology

## 2016-11-03 ENCOUNTER — Ambulatory Visit
Admission: RE | Admit: 2016-11-03 | Discharge: 2016-11-03 | Disposition: A | Discharge status: Home / Self Care | Payer: Managed Care, Other (non HMO) | Source: Ambulatory Visit | Attending: Obstetrics and Gynecology | Admitting: Obstetrics and Gynecology

## 2016-11-03 DIAGNOSIS — Z01419 Encounter for gynecological examination (general) (routine) without abnormal findings: Secondary | ICD-10-CM

## 2016-11-03 DIAGNOSIS — Z1231 Encounter for screening mammogram for malignant neoplasm of breast: Secondary | ICD-10-CM | POA: Diagnosis present

## 2016-11-11 ENCOUNTER — Encounter: Payer: Self-pay | Admitting: Obstetrics and Gynecology

## 2016-11-11 ENCOUNTER — Other Ambulatory Visit: Payer: Self-pay | Admitting: *Deleted

## 2016-11-11 MED ORDER — FLUOXETINE HCL 20 MG PO CAPS: 20.0000 mg | ORAL_CAPSULE | Freq: Every day | ORAL | 6 refills | Status: DC

## 2016-11-22 ENCOUNTER — Ambulatory Visit (INDEPENDENT_AMBULATORY_CARE_PROVIDER_SITE_OTHER): Payer: Managed Care, Other (non HMO)

## 2016-11-22 ENCOUNTER — Ambulatory Visit (INDEPENDENT_AMBULATORY_CARE_PROVIDER_SITE_OTHER): Payer: Managed Care, Other (non HMO) | Admitting: Podiatry

## 2016-11-22 ENCOUNTER — Ambulatory Visit: Payer: Managed Care, Other (non HMO)

## 2016-11-22 VITALS — BP 113/74 | HR 81 | Temp 98.6°F | Resp 16

## 2016-11-22 DIAGNOSIS — R29898 Other symptoms and signs involving the musculoskeletal system: Secondary | ICD-10-CM

## 2016-11-22 DIAGNOSIS — R52 Pain, unspecified: Secondary | ICD-10-CM | POA: Diagnosis not present

## 2016-11-22 DIAGNOSIS — M79671 Pain in right foot: Secondary | ICD-10-CM | POA: Diagnosis not present

## 2016-11-22 DIAGNOSIS — M722 Plantar fascial fibromatosis: Secondary | ICD-10-CM | POA: Diagnosis not present

## 2016-11-23 ENCOUNTER — Ambulatory Visit (INDEPENDENT_AMBULATORY_CARE_PROVIDER_SITE_OTHER): Payer: Self-pay | Admitting: Orthotics

## 2016-11-23 DIAGNOSIS — R52 Pain, unspecified: Secondary | ICD-10-CM

## 2016-11-25 ENCOUNTER — Ambulatory Visit: Payer: Self-pay | Admitting: Podiatry

## 2016-11-26 NOTE — Progress Notes (Signed)
   HPI: Patient presents today for evaluation of bilateral foot pain. Patient has a history of previous fractures to the right foot several years ago. Patient states that she's been having pain all over her feet which have progressively gotten worse. Patient works on her feet all day long standing. Patient is tried different insoles and ibuprofen with minimal relief.     Physical Exam: General: The patient is alert and oriented x3 in no acute distress.  Dermatology: Skin is warm, dry and supple bilateral lower extremities. Negative for open lesions or macerations.  Vascular: Palpable pedal pulses bilaterally. No edema or erythema noted. Capillary refill within normal limits.  Neurological: Epicritic and protective threshold grossly intact bilaterally.   Musculoskeletal Exam: Range of motion within normal limits to all pedal and ankle joints bilateral. Muscle strength 5/5 in all groups bilateral.   Radiographic Exam:  Normal osseous mineralization. Joint spaces preserved. No fracture/dislocation/boney destruction.    Assessment: 1. Generalized foot pain/fatigue   Plan of Care:  1. Patient was evaluated. X-rays reviewed today 2. Today we recommended the patient make an appointment with Liliane Channel, Pedorthist for custom molded orthotics to help alleviate foot pain when standing all day long 3. Return to clinic when necessary   Edrick Kins, DPM [facility]  Dr. Edrick Kins, DPM    [address], [place] [address]                Office [phone]  Fax [phone]

## 2016-12-14 ENCOUNTER — Encounter: Payer: Managed Care, Other (non HMO) | Admitting: Orthotics

## 2016-12-28 ENCOUNTER — Ambulatory Visit: Payer: 59 | Admitting: Orthotics

## 2016-12-28 DIAGNOSIS — R52 Pain, unspecified: Secondary | ICD-10-CM

## 2016-12-28 NOTE — Progress Notes (Signed)
Patient came in today to pick up custom made foot orthotics.  The goals were accomplished and the patient reported no dissatisfaction with said orthotics.  Patient was advised of breakin period and how to report any issues. 

## 2016-12-28 NOTE — Progress Notes (Signed)
Patient came in today to pick up custom made foot orthotics.  The goals were accomplished and the patient reported no dissatisfaction with said orthotics.  Patient was advised of breakin period and how to report any issues.   Since, patient has deductible and charges havent yet been filed, she will self-pay for $300.00

## 2017-01-02 ENCOUNTER — Encounter: Payer: Self-pay | Admitting: Obstetrics and Gynecology

## 2017-01-02 ENCOUNTER — Other Ambulatory Visit: Payer: Self-pay | Admitting: *Deleted

## 2017-01-04 ENCOUNTER — Other Ambulatory Visit: Payer: Self-pay | Admitting: *Deleted

## 2017-01-04 DIAGNOSIS — B3731 Acute candidiasis of vulva and vagina: Secondary | ICD-10-CM

## 2017-01-04 DIAGNOSIS — B373 Candidiasis of vulva and vagina: Secondary | ICD-10-CM

## 2017-01-04 MED ORDER — FLUCONAZOLE 150 MG PO TABS: 150.0000 mg | ORAL_TABLET | Freq: Once | ORAL | 2 refills | Status: AC

## 2017-03-08 ENCOUNTER — Ambulatory Visit (INDEPENDENT_AMBULATORY_CARE_PROVIDER_SITE_OTHER): Payer: 59 | Admitting: Orthotics

## 2017-03-08 DIAGNOSIS — R29898 Other symptoms and signs involving the musculoskeletal system: Secondary | ICD-10-CM

## 2017-03-14 NOTE — Progress Notes (Signed)
Patient seen to day for evaluation R F/O.  Returned w/ the folllowing modification recommendations:  Hug arch, Heel punch, dancers pad.

## 2017-03-28 ENCOUNTER — Encounter: Payer: 59 | Admitting: Orthotics

## 2017-04-19 ENCOUNTER — Encounter: Payer: 59 | Admitting: Orthotics

## 2017-05-17 ENCOUNTER — Encounter: Payer: Managed Care, Other (non HMO) | Admitting: Obstetrics and Gynecology

## 2017-06-28 ENCOUNTER — Ambulatory Visit: Payer: 59 | Admitting: Orthotics

## 2017-06-28 DIAGNOSIS — R29898 Other symptoms and signs involving the musculoskeletal system: Secondary | ICD-10-CM

## 2017-06-28 DIAGNOSIS — R52 Pain, unspecified: Secondary | ICD-10-CM

## 2017-06-28 NOTE — Progress Notes (Signed)
Right foot at first mpj still causing discomfort.  Reworking right f/o to add k wedge and reverse morton's extension.

## 2017-07-20 ENCOUNTER — Encounter: Payer: Self-pay | Admitting: Obstetrics and Gynecology

## 2017-07-20 ENCOUNTER — Ambulatory Visit (INDEPENDENT_AMBULATORY_CARE_PROVIDER_SITE_OTHER): Payer: 59 | Admitting: Obstetrics and Gynecology

## 2017-07-20 VITALS — BP 127/76 | HR 73 | Ht 61.0 in | Wt 216.6 lb

## 2017-07-20 DIAGNOSIS — G2581 Restless legs syndrome: Secondary | ICD-10-CM | POA: Diagnosis not present

## 2017-07-20 DIAGNOSIS — Z01419 Encounter for gynecological examination (general) (routine) without abnormal findings: Secondary | ICD-10-CM | POA: Diagnosis not present

## 2017-07-20 NOTE — Progress Notes (Signed)
Subjective:   Kristin Brown is a 57 y.o. No obstetric history on file. Caucasian female here for a routine well-woman exam.  No LMP recorded. Patient has had a hysterectomy.    Current complaints: skin on fingers dry and cracking-saw dermatologist. [name]       does desire labs  Social History: Sexual: heterosexual Marital Status: married Living situation: with family Occupation: FT at [employer] Tobacco/alcohol: no tobacco use Illicit drugs: no history of illicit drug use  The following portions of the patient's history were reviewed and updated as appropriate: allergies, current medications, past family history, past medical history, past social history, past surgical history and problem list.  Past Medical History Past Medical History:  Diagnosis Date  . Cervical cancer ([place]) 2003  . GERD (gastroesophageal reflux disease)     Past Surgical History Past Surgical History:  Procedure Laterality Date  . ABDOMINAL HYSTERECTOMY  2003    Gynecologic History No obstetric history on file.  No LMP recorded. Patient has had a hysterectomy. Contraception: status post hysterectomy Last Pap: 2018. Results were: normal Last mammogram: 10/2016. Results were: normal   Obstetric History OB History  No data available    Current Medications Current Outpatient Medications on File Prior to Visit  Medication Sig Dispense Refill  . Ascorbic Acid (VITAMIN C) 1000 MG tablet Take 1 tablet (1,000 mg total) by mouth daily.    Marland Kitchen aspirin EC 81 MG tablet Take by mouth.    . Biotin 5000 MCG SUBL Place under the tongue.    . Black Cohosh 160 MG CAPS Take by mouth.    . clobetasol ointment (TEMOVATE) 0.05 % Apply topically.    . EUCRISA 2 % OINT Apply topically 2 (two) times daily. to affected area  2  . FLUoxetine (PROZAC) 20 MG capsule Take 1 capsule (20 mg total) by mouth daily. 30 capsule 6  . gabapentin (NEURONTIN) 100 MG capsule Take 300 mg at night.    . Magnesium 250 MG TABS Take  1 tablet (250 mg total) by mouth daily.  0  . Multiple Vitamin (MULTIVITAMIN) capsule Take by mouth.    . pantoprazole (PROTONIX) 40 MG tablet Take by mouth.    . Potassium 99 MG TABS Take 1 tablet (99 mg total) by mouth daily. 330 each 0  . cyclobenzaprine (FLEXERIL) 5 MG tablet Take by mouth.    Marland Kitchen ibuprofen (ADVIL,MOTRIN) 400 MG tablet Take by mouth.     No current facility-administered medications on file prior to visit.     Review of Systems Patient denies any headaches, blurred vision, shortness of breath, chest pain, abdominal pain, problems with bowel movements, urination, or intercourse.  Objective:  BP 127/76   Pulse 73   Ht 5\' 1"  (1.549 m)   Wt 216 lb 9.6 oz (98.2 kg)   BMI 40.93 kg/m  Physical Exam  General:  Well developed, well nourished, no acute distress. She is alert and oriented x3. Skin:  Warm and dry Neck:  Midline trachea, no thyromegaly or nodules Cardiovascular: Regular rate and rhythm, no murmur heard Lungs:  Effort normal, all lung fields clear to auscultation bilaterally Breasts:  No dominant palpable mass, retraction, or nipple discharge Abdomen:  Soft, non tender, no hepatosplenomegaly or masses Pelvic:  External genitalia is normal in appearance.  The vagina is normal in appearance. The cervix is bulbous, no CMT.  Thin prep pap is not done. Uterus is surgically absent.  No adnexal masses or tenderness noted. Extremities:  No swelling or varicosities noted Psych:  She has a normal mood and affect  Assessment:   Healthy well-woman exam S/p ovarian cancer Obesity RLS   Plan:  Labs obtained- will follow up accordingly F/U 1 year for AE, or sooner if needed Mammogram ordered  Willodean Leven Rockney Ghee, CNM

## 2017-07-21 LAB — COMPREHENSIVE METABOLIC PANEL
ALK PHOS: 58 IU/L (ref 39–117)
ALT: 21 IU/L (ref 0–32)
AST: 20 IU/L (ref 0–40)
Albumin/Globulin Ratio: 2 (ref 1.2–2.2)
Albumin: 4.7 g/dL (ref 3.5–5.5)
BILIRUBIN TOTAL: 0.4 mg/dL (ref 0.0–1.2)
BUN/Creatinine Ratio: 24 — ABNORMAL HIGH (ref 9–23)
BUN: 17 mg/dL (ref 6–24)
CHLORIDE: 101 mmol/L (ref 96–106)
CO2: 25 mmol/L (ref 20–29)
Calcium: 9.6 mg/dL (ref 8.7–10.2)
Creatinine, Ser: 0.72 mg/dL (ref 0.57–1.00)
GFR calc non Af Amer: 94 mL/min/{1.73_m2} (ref >59)
GFR, EST AFRICAN AMERICAN: 108 mL/min/{1.73_m2} (ref >59)
GLUCOSE: 93 mg/dL (ref 65–99)
Globulin, Total: 2.3 g/dL (ref 1.5–4.5)
Potassium: 4.4 mmol/L (ref 3.5–5.2)
Sodium: 142 mmol/L (ref 134–144)
TOTAL PROTEIN: 7 g/dL (ref 6.0–8.5)

## 2017-07-21 LAB — CBC
HEMOGLOBIN: 13.2 g/dL (ref 11.1–15.9)
Hematocrit: 40.9 % (ref 34.0–46.6)
MCH: 29.1 pg (ref 26.6–33.0)
MCHC: 32.3 g/dL (ref 31.5–35.7)
MCV: 90 fL (ref 79–97)
PLATELETS: 355 10*3/uL (ref 150–379)
RBC: 4.54 x10E6/uL (ref 3.77–5.28)
RDW: 14.3 % (ref 12.3–15.4)
WBC: 5.8 10*3/uL (ref 3.4–10.8)

## 2017-07-21 LAB — THYROID PANEL WITH TSH
FREE THYROXINE INDEX: 1.8 (ref 1.2–4.9)
T3 UPTAKE RATIO: 23 % — AB (ref 24–39)
T4, Total: 8 ug/dL (ref 4.5–12.0)
TSH: 1.87 u[IU]/mL (ref 0.450–4.500)

## 2017-07-21 LAB — LIPID PANEL
Chol/HDL Ratio: 3.5 ratio (ref 0.0–4.4)
Cholesterol, Total: 212 mg/dL — ABNORMAL HIGH (ref 100–199)
HDL: 60 mg/dL (ref >39)
LDL Calculated: 121 mg/dL — ABNORMAL HIGH (ref 0–99)
Triglycerides: 154 mg/dL — ABNORMAL HIGH (ref 0–149)
VLDL Cholesterol Cal: 31 mg/dL (ref 5–40)

## 2017-07-21 LAB — B12 AND FOLATE PANEL
Folate: >20 ng/mL (ref >3.0)
VITAMIN B 12: 1340 pg/mL — AB (ref 232–1245)

## 2017-07-21 LAB — HEMOGLOBIN A1C
Est. average glucose Bld gHb Est-mCnc: 117 mg/dL
HEMOGLOBIN A1C: 5.7 % — AB (ref 4.8–5.6)

## 2017-08-08 ENCOUNTER — Telehealth: Payer: Self-pay | Admitting: Podiatry

## 2017-08-08 NOTE — Telephone Encounter (Signed)
Pt left message checking to see if her one orthotics is back.   I called pt back and Liliane Channel is checking in the [place] office in the morning and will have someone from that office call pt back with status.

## 2017-08-16 ENCOUNTER — Encounter: Payer: Self-pay | Admitting: Obstetrics and Gynecology

## 2017-08-20 ENCOUNTER — Other Ambulatory Visit: Payer: Self-pay | Admitting: Obstetrics and Gynecology

## 2017-12-04 ENCOUNTER — Other Ambulatory Visit: Payer: Self-pay | Admitting: Family Medicine

## 2017-12-04 DIAGNOSIS — Z1231 Encounter for screening mammogram for malignant neoplasm of breast: Secondary | ICD-10-CM

## 2017-12-19 ENCOUNTER — Ambulatory Visit
Admission: RE | Admit: 2017-12-19 | Discharge: 2017-12-19 | Disposition: A | Discharge status: Home / Self Care | Payer: 59 | Source: Ambulatory Visit | Attending: Family Medicine | Admitting: Family Medicine

## 2017-12-19 DIAGNOSIS — Z1231 Encounter for screening mammogram for malignant neoplasm of breast: Secondary | ICD-10-CM | POA: Insufficient documentation

## 2018-01-10 IMAGING — MG MM DIGITAL SCREENING BILAT W/ CAD
5 series · 5 of 5 positions shown · non-contrast
Comparison: Previous exam(s).

CLINICAL DATA: Screening.

EXAM:
DIGITAL SCREENING BILATERAL MAMMOGRAM WITH CAD

[R MLO]
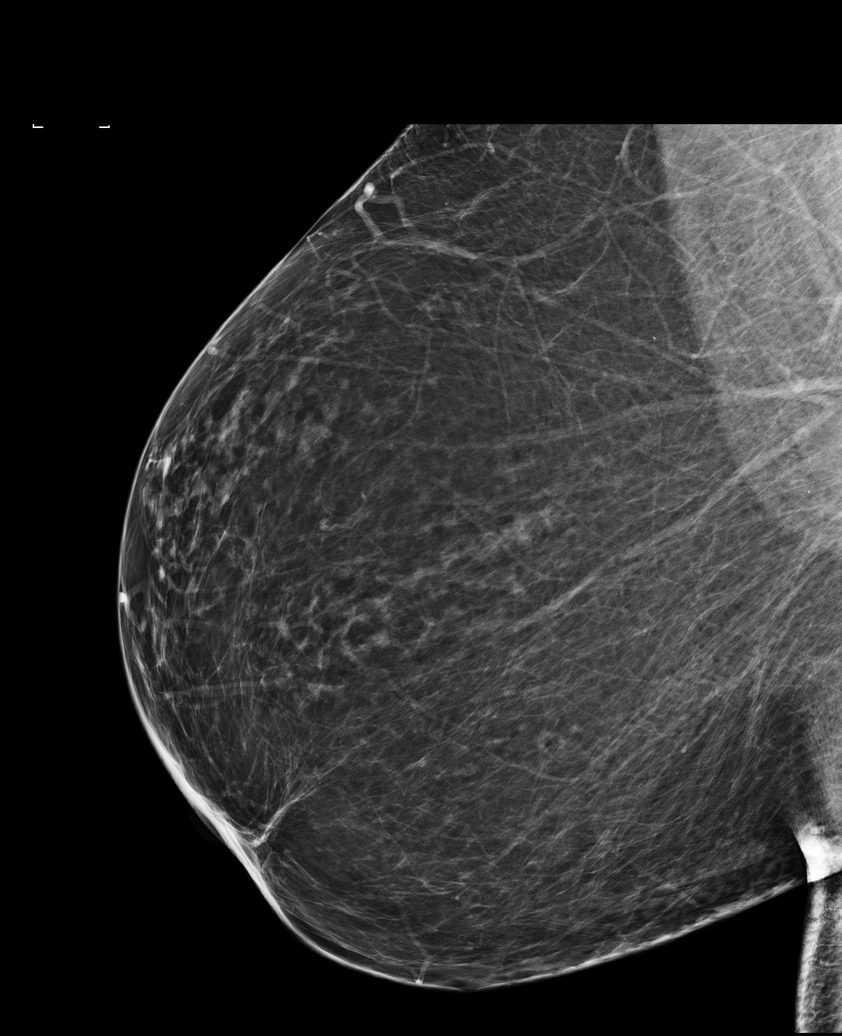

[R CC]
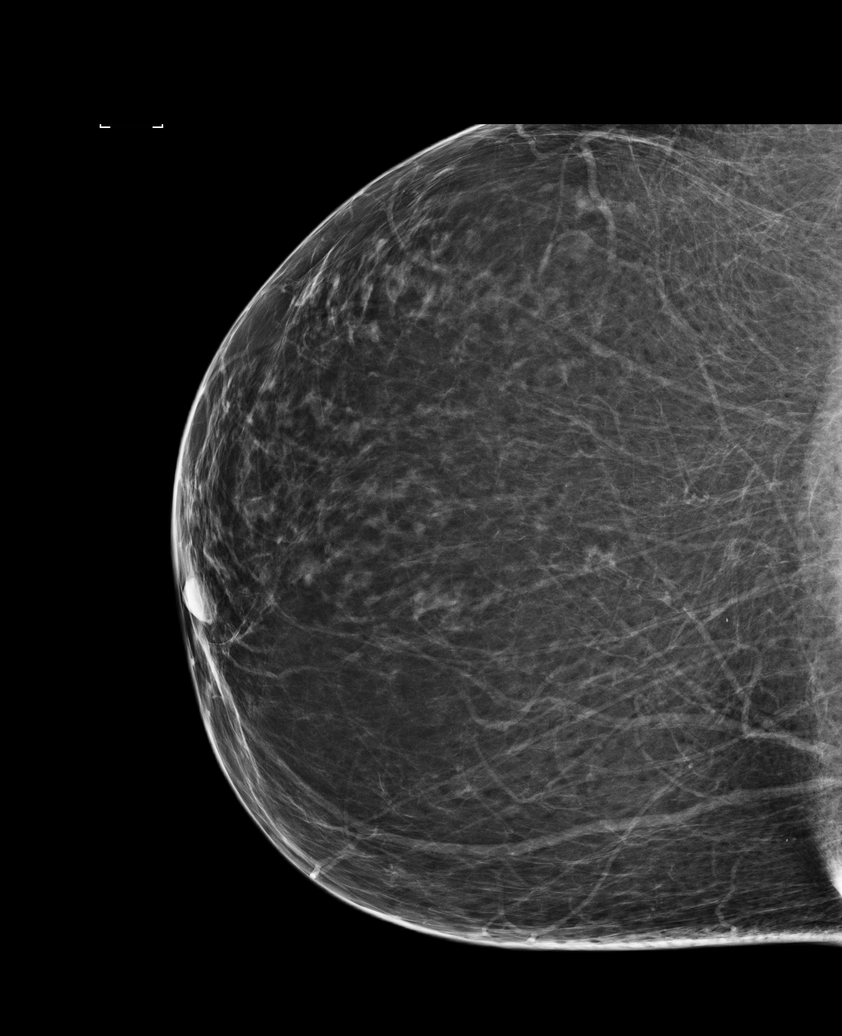

[L CC]
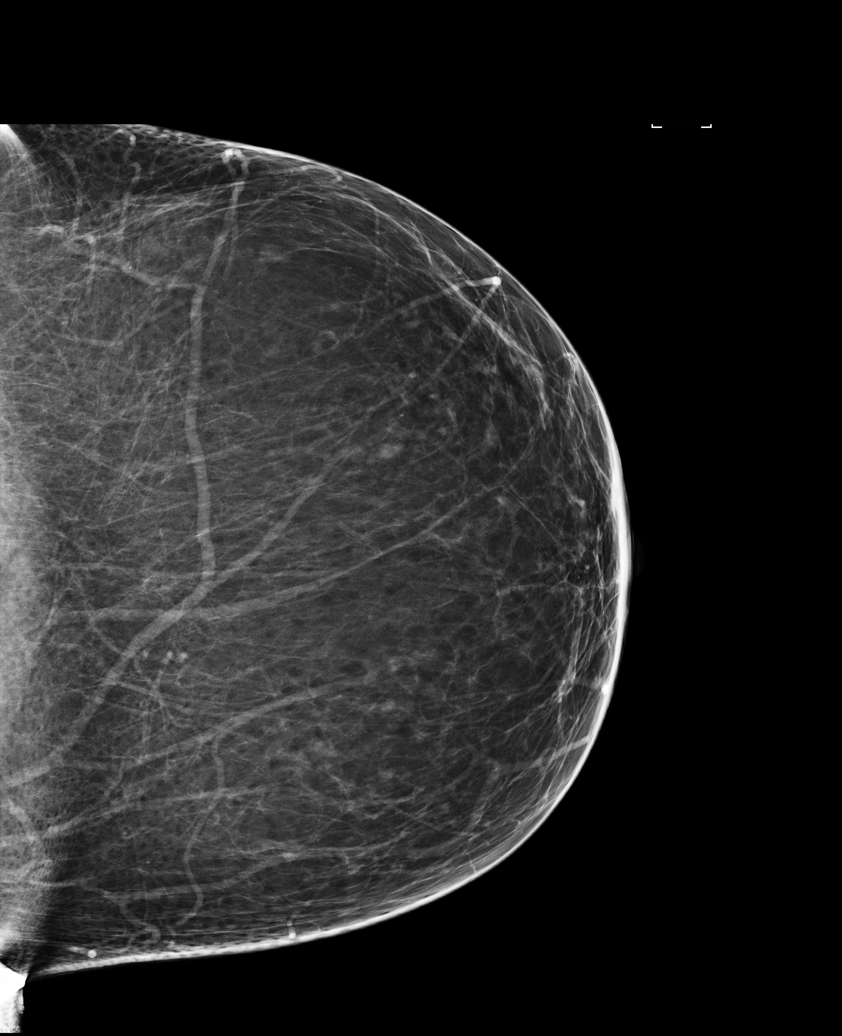

[R XCCL]
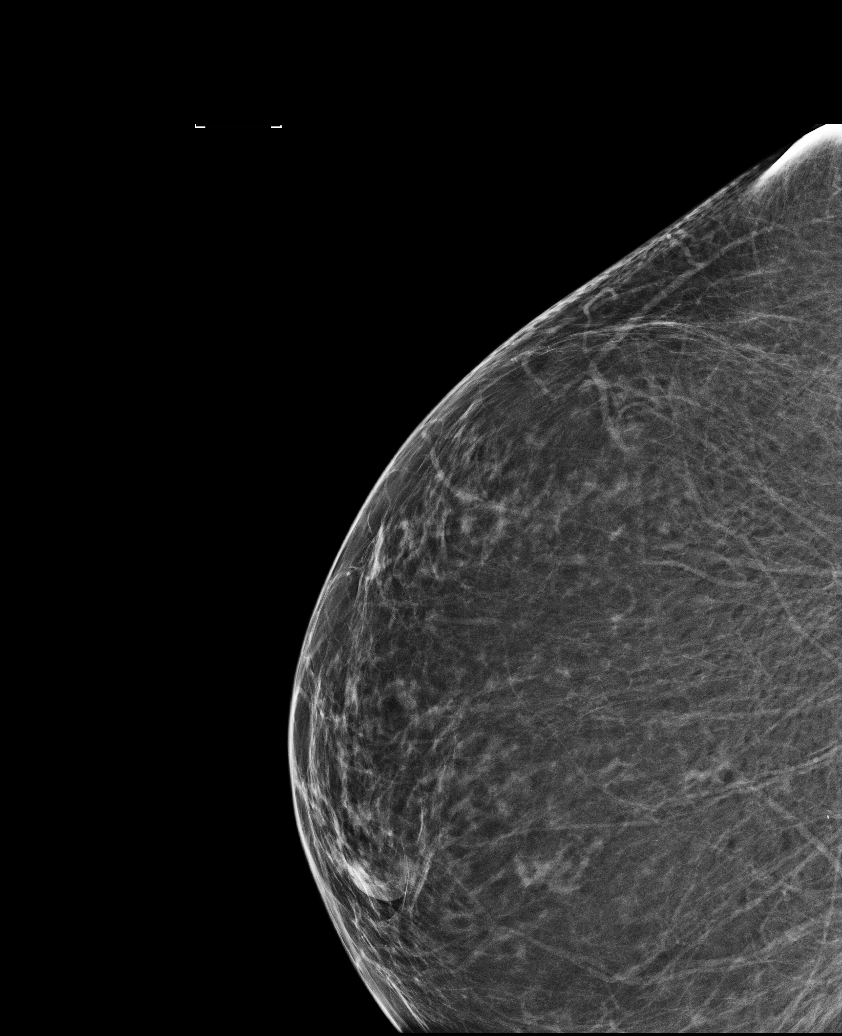

[L MLO]
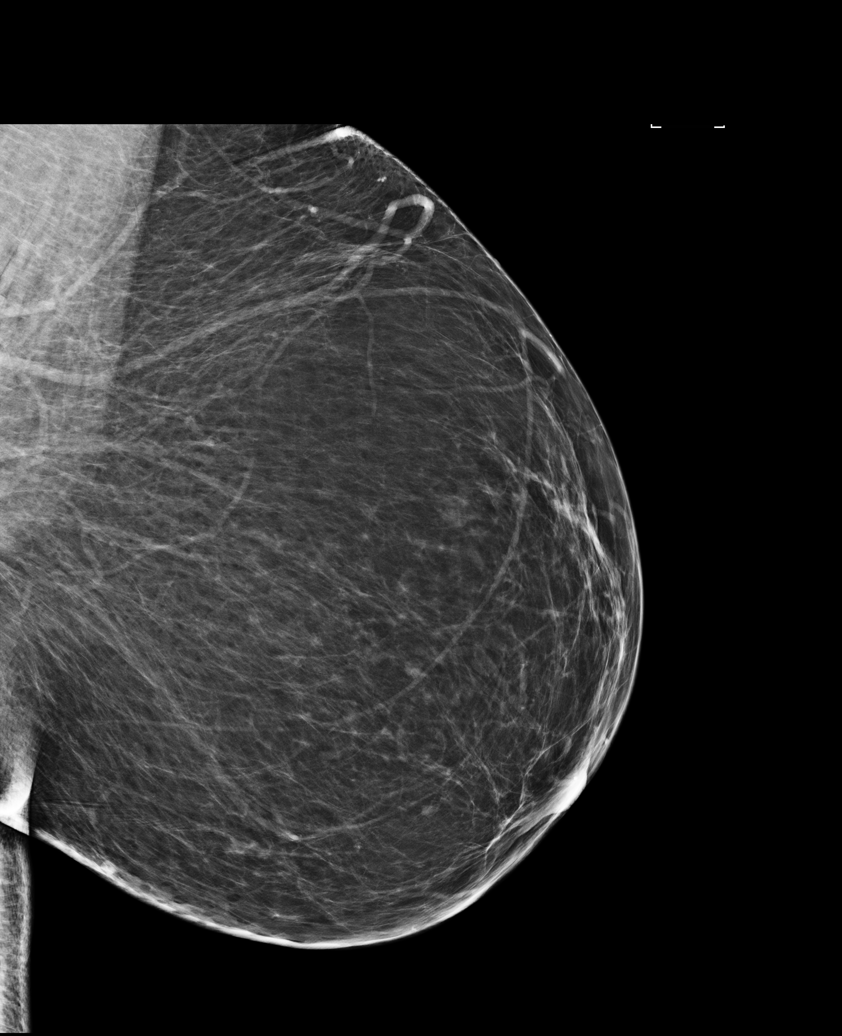

[5 of 5 positions shown; findings below may reference images not displayed]

ACR Breast Density Category b: There are scattered areas of
fibroglandular density.
FINDINGS: There are no findings suspicious for malignancy. Images were
processed with CAD.
IMPRESSION: No mammographic evidence of malignancy. A result letter of this
screening mammogram will be mailed directly to the patient.

RECOMMENDATION:
Screening mammogram in one year. (Code:AS-G-LCT)

BI-RADS CATEGORY  1: Negative.

## 2018-01-12 ENCOUNTER — Ambulatory Visit: Payer: 59 | Admitting: Podiatry

## 2018-01-12 ENCOUNTER — Encounter: Payer: Self-pay | Admitting: Podiatry

## 2018-01-12 ENCOUNTER — Ambulatory Visit: Payer: 59

## 2018-01-12 DIAGNOSIS — M778 Other enthesopathies, not elsewhere classified: Secondary | ICD-10-CM

## 2018-01-12 DIAGNOSIS — M779 Enthesopathy, unspecified: Secondary | ICD-10-CM

## 2018-01-12 DIAGNOSIS — M7751 Other enthesopathy of right foot: Secondary | ICD-10-CM

## 2018-01-14 NOTE — Progress Notes (Signed)
   HPI: 57 year old female presents the office today for follow-up evaluation regarding chronic right foot pain.  Patient continues to experience sharp pains that are most symptomatic at nighttime.  She says that it keeps her up at night.  She is still having right foot pain at the top of the foot and first MTPJ.  She states that swelling comes and goes.  Topical diclofenac gel does help to alleviate symptoms as well as over-the-counter Aleve.  Patient has been wearing her custom molded orthotics.  She presents for further treatment evaluation.  Past Medical History:  Diagnosis Date  . Cervical cancer ([place]) 2003  . GERD (gastroesophageal reflux disease)      Physical Exam: General: The patient is alert and oriented x3 in no acute distress.  Dermatology: Skin is warm, dry and supple bilateral lower extremities. Negative for open lesions or macerations.  Vascular: Palpable pedal pulses bilaterally. No edema or erythema noted. Capillary refill within normal limits.  Neurological: Epicritic and protective threshold grossly intact bilaterally.   Musculoskeletal Exam: Currently there is no significant pain on palpation throughout the right foot.  Patient states that the pain comes mostly at nighttime.  Range of motion within normal limits to all pedal and ankle joints bilateral. Muscle strength 5/5 in all groups bilateral.   Assessment: 1.  Midfoot capsulitis-medial right foot, nocturnal 2.  First MTPJ capsulitis right, nocturnal   Plan of Care:  1. Patient evaluated.  2.  Continue custom molded orthotics during the day 3.  Injection of 0.5 cc Celestone Soluspan injection of the right midfoot at the level of the Lisfranc joint 4.  Injection of 0.5 cc Celestone Soluspan injection of the first MTPJ right foot 5.  Continue gabapentin nightly as per PCP 6.  Return to clinic in 4 weeks      Edrick Kins, DPM [facility]  Dr. Edrick Kins, DPM    [address], [place] [address]                Office [phone]  Fax [phone]

## 2018-02-16 ENCOUNTER — Encounter: Payer: Self-pay | Admitting: Podiatry

## 2018-02-16 ENCOUNTER — Ambulatory Visit: Payer: 59 | Admitting: Podiatry

## 2018-02-16 DIAGNOSIS — M779 Enthesopathy, unspecified: Secondary | ICD-10-CM | POA: Diagnosis not present

## 2018-02-16 DIAGNOSIS — M778 Other enthesopathies, not elsewhere classified: Secondary | ICD-10-CM

## 2018-02-16 DIAGNOSIS — M7751 Other enthesopathy of right foot: Secondary | ICD-10-CM

## 2018-02-19 NOTE — Progress Notes (Signed)
   HPI: 57 year old female presents the office today for follow-up evaluation regarding chronic right foot pain. She states the pain is gradually returning but it is not as bad as it was previously. She states the injections provided significant temporary relief. Patient is here for further evaluation and treatment.   Past Medical History:  Diagnosis Date  . Cervical cancer ([place]) 2003  . GERD (gastroesophageal reflux disease)      Physical Exam: General: The patient is alert and oriented x3 in no acute distress.  Dermatology: Skin is warm, dry and supple bilateral lower extremities. Negative for open lesions or macerations.  Vascular: Palpable pedal pulses bilaterally. No edema or erythema noted. Capillary refill within normal limits.  Neurological: Epicritic and protective threshold grossly intact bilaterally.   Musculoskeletal Exam: Currently there is no significant pain on palpation throughout the right foot.  Patient states that the pain comes mostly at nighttime.  Range of motion within normal limits to all pedal and ankle joints bilateral. Muscle strength 5/5 in all groups bilateral.   Assessment: 1.  Midfoot capsulitis-medial right foot, nocturnal 2.  First MTPJ capsulitis right, nocturnal   Plan of Care:  1.  Patient evaluated.  2.  Continue custom molded orthotics during the day 3.  Injection of 0.5 cc Celestone Soluspan injection of the right midfoot at the level of the Lisfranc joint 4.  Injection of 0.5 cc Celestone Soluspan injection of the first MTPJ right foot 5.  Continue gabapentin nightly as per PCP 6.  Return to clinic as needed.       Edrick Kins, DPM [facility]  Dr. Edrick Kins, DPM    [address], [place] [address]                Office [phone]  Fax [phone]

## 2018-03-25 ENCOUNTER — Other Ambulatory Visit: Payer: Self-pay | Admitting: Obstetrics and Gynecology

## 2018-05-02 ENCOUNTER — Ambulatory Visit: Payer: 59 | Admitting: Podiatry

## 2018-05-02 DIAGNOSIS — M7751 Other enthesopathy of right foot: Secondary | ICD-10-CM | POA: Diagnosis not present

## 2018-05-02 DIAGNOSIS — M778 Other enthesopathies, not elsewhere classified: Secondary | ICD-10-CM

## 2018-05-02 DIAGNOSIS — M779 Enthesopathy, unspecified: Principal | ICD-10-CM

## 2018-05-02 MED ORDER — MELOXICAM 15 MG PO TABS: 15.0000 mg | ORAL_TABLET | Freq: Every day | ORAL | 1 refills | Status: AC

## 2018-05-06 NOTE — Progress Notes (Signed)
   HPI: 58 year old female presents the office today for follow-up evaluation regarding chronic right foot pain. She states the pain had improved but then came back in the past couple of weeks. Walking and moving the foot increases the pain. She has been taking Gabapentin and using her orthotics as directed. Patient is here for further evaluation and treatment.   Past Medical History:  Diagnosis Date  . Cervical cancer ([place]) 2003  . GERD (gastroesophageal reflux disease)      Physical Exam: General: The patient is alert and oriented x3 in no acute distress.  Dermatology: Skin is warm, dry and supple bilateral lower extremities. Negative for open lesions or macerations.  Vascular: Palpable pedal pulses bilaterally. No edema or erythema noted. Capillary refill within normal limits.  Neurological: Epicritic and protective threshold grossly intact bilaterally.   Musculoskeletal Exam: Pain on palpation throughout the right foot. Range of motion within normal limits to all pedal and ankle joints bilateral. Muscle strength 5/5 in all groups bilateral.   Assessment: 1.  Midfoot capsulitis - medial right foot 2.  First MTPJ capsulitis right   Plan of Care:  1.  Patient evaluated.  2.  Continue custom molded orthotics during the day 3.  Injection of 0.5 cc Celestone Soluspan injection of the right midfoot at the level of the Lisfranc joint 4.  Injection of 0.5 cc Celestone Soluspan injection of the first MTPJ right foot 5.  Continue gabapentin nightly as per PCP 6.  Prescription for Meloxicam provided to patient. 7.  Return to clinic in 8 weeks.        Edrick Kins, DPM [facility]  Dr. Edrick Kins, DPM    [address], [place] [address]                Office [phone]  Fax [phone]

## 2018-06-26 ENCOUNTER — Ambulatory Visit: Payer: 59 | Admitting: Podiatry

## 2018-06-26 ENCOUNTER — Encounter: Payer: Self-pay | Admitting: Podiatry

## 2018-06-26 DIAGNOSIS — M779 Enthesopathy, unspecified: Secondary | ICD-10-CM | POA: Diagnosis not present

## 2018-06-26 DIAGNOSIS — M778 Other enthesopathies, not elsewhere classified: Secondary | ICD-10-CM

## 2018-06-26 DIAGNOSIS — M7751 Other enthesopathy of right foot: Secondary | ICD-10-CM | POA: Diagnosis not present

## 2018-06-26 MED ORDER — MELOXICAM 15 MG PO TABS: 15.0000 mg | ORAL_TABLET | Freq: Every day | ORAL | 1 refills | Status: DC

## 2018-06-29 NOTE — Progress Notes (Signed)
   HPI: 58 year old female presents the office today for follow-up evaluation of right foot pain. She states she is doing well. She denies any pain or modifying symptoms. She has been taking Meloxicam for treatment. Patient is here for further evaluation and treatment.   Past Medical History:  Diagnosis Date  . Cervical cancer ([place]) 2003  . GERD (gastroesophageal reflux disease)      Physical Exam: General: The patient is alert and oriented x3 in no acute distress.  Dermatology: Skin is warm, dry and supple bilateral lower extremities. Negative for open lesions or macerations.  Vascular: Palpable pedal pulses bilaterally. No edema or erythema noted. Capillary refill within normal limits.  Neurological: Epicritic and protective threshold grossly intact bilaterally.   Musculoskeletal Exam: Range of motion within normal limits to all pedal and ankle joints bilateral. Muscle strength 5/5 in all groups bilateral.   Assessment: 1. Midfoot capsulitis - medial right foot - resolved 2. First MTPJ capsulitis right - resolved    Plan of Care:  1. Patient evaluated.  2. Continue using custom orthotics.  3. Continue taking Meloxicam as needed.  4. Continue taking Gabapentin nightly as directed by PCP. 5. Return to clinic as needed.     Edrick Kins, DPM [facility]  Dr. Edrick Kins, DPM    [address], [place] [address]                Office [phone]  Fax [phone]

## 2018-07-24 ENCOUNTER — Encounter: Payer: 59 | Admitting: Obstetrics and Gynecology

## 2018-09-20 ENCOUNTER — Ambulatory Visit (INDEPENDENT_AMBULATORY_CARE_PROVIDER_SITE_OTHER): Payer: 59 | Admitting: Obstetrics and Gynecology

## 2018-09-20 ENCOUNTER — Other Ambulatory Visit: Payer: Self-pay

## 2018-09-20 ENCOUNTER — Encounter: Payer: Self-pay | Admitting: Obstetrics and Gynecology

## 2018-09-20 VITALS — BP 125/68 | HR 93 | Ht 62.0 in | Wt 229.1 lb

## 2018-09-20 DIAGNOSIS — Z01419 Encounter for gynecological examination (general) (routine) without abnormal findings: Secondary | ICD-10-CM | POA: Diagnosis not present

## 2018-09-20 NOTE — Progress Notes (Signed)
Subjective:   Kristin Brown is a 58 y.o. No obstetric history on file. Caucasian female here for a routine well-woman exam.  No LMP recorded. Patient has had a hysterectomy.    Current complaints: weight went up. [name]       Does need routine labs  Social History: Sexual: heterosexual Marital Status: married Living situation: with family Occupation: FT at [employer] Tobacco/alcohol: no tobacco use Illicit drugs: no history of illicit drug use  The following portions of the patient's history were reviewed and updated as appropriate: allergies, current medications, past family history, past medical history, past social history, past surgical history and problem list.  Past Medical History Past Medical History:  Diagnosis Date  . Cervical cancer ([place]) 2003  . GERD (gastroesophageal reflux disease)     Past Surgical History Past Surgical History:  Procedure Laterality Date  . ABDOMINAL HYSTERECTOMY  2003    Gynecologic History No obstetric history on file.  No LMP recorded. Patient has had a hysterectomy. Contraception: status post hysterectomy Last Pap: 08/2016. Results were: normal Last mammogram: 12/2017. Results were: normal   Obstetric History OB History  No obstetric history on file.    Current Medications Current Outpatient Medications on File Prior to Visit  Medication Sig Dispense Refill  . aspirin EC 81 MG tablet Take by mouth.    . clobetasol ointment (TEMOVATE) 0.05 % as needed.     . Cyanocobalamin (VITAMIN B-12 PO) Take by mouth. 2500 mcg    . EUCRISA 2 % OINT Apply topically as needed. to affected area  2  . FLUoxetine (PROZAC) 20 MG capsule TAKE 1 CAPSULE BY MOUTH ONCE DAILY 30 capsule 6  . gabapentin (NEURONTIN) 100 MG capsule Take 300 mg at night.    . Multiple Vitamin (MULTIVITAMIN) capsule Take by mouth.    . pantoprazole (PROTONIX) 40 MG tablet Take by mouth.    . Biotin 5000 MCG SUBL Place under the tongue.    . ferrous gluconate (IRON  27) 240 (27 FE) MG tablet Take 240 mg by mouth 3 (three) times daily with meals.    . naproxen sodium (ALEVE) 220 MG tablet Take 220 mg by mouth.     No current facility-administered medications on file prior to visit.     Review of Systems Patient denies any headaches, blurred vision, shortness of breath, chest pain, abdominal pain, problems with bowel movements, urination, or intercourse.  Objective:  BP 125/68   Pulse 93   Ht 5\' 2"  (1.575 m)   Wt 229 lb 1.6 oz (103.9 kg)   BMI 41.90 kg/m  Physical Exam  General:  Well developed, well nourished, no acute distress. She is alert and oriented x3. Skin:  Warm and dry Neck:  Midline trachea, no thyromegaly or nodules Cardiovascular: Regular rate and rhythm, no murmur heard Lungs:  Effort normal, all lung fields clear to auscultation bilaterally Breasts:  No dominant palpable mass, retraction, or nipple discharge Abdomen:  Soft, non tender, no hepatosplenomegaly or masses Pelvic:  External genitalia is normal in appearance.  The vagina is normal in appearance. The cervix is bulbous, no CMT.  Thin prep pap is not done. Uterus is surgically absent.  No adnexal masses or tenderness noted. Extremities:  No swelling or varicosities noted Psych:  She has a normal mood and affect  Assessment:   Healthy well-woman exam BMI 41.9 RLS H/o cervical cancer   Plan:  Labs obtained-will follow up accordingly F/U 1 year for AE, or sooner  if needed Mammogram ordered or sooner if problems Never had BDS-ordered Colonoscopy overdue, she has already called to schedule it. or sooner if problems  Melody Rockney Ghee, CNM

## 2018-09-21 LAB — LIPID PANEL
Chol/HDL Ratio: 3.3 ratio (ref 0.0–4.4)
Cholesterol, Total: 193 mg/dL (ref 100–199)
HDL: 58 mg/dL (ref >39)
LDL Calculated: 102 mg/dL — ABNORMAL HIGH (ref 0–99)
Triglycerides: 167 mg/dL — ABNORMAL HIGH (ref 0–149)
VLDL Cholesterol Cal: 33 mg/dL (ref 5–40)

## 2018-09-21 LAB — CBC
Hematocrit: 38.2 % (ref 34.0–46.6)
Hemoglobin: 12.7 g/dL (ref 11.1–15.9)
MCH: 29.3 pg (ref 26.6–33.0)
MCHC: 33.2 g/dL (ref 31.5–35.7)
MCV: 88 fL (ref 79–97)
Platelets: 301 10*3/uL (ref 150–450)
RBC: 4.33 x10E6/uL (ref 3.77–5.28)
RDW: 13.2 % (ref 11.7–15.4)
WBC: 5.9 10*3/uL (ref 3.4–10.8)

## 2018-09-21 LAB — VITAMIN D 25 HYDROXY (VIT D DEFICIENCY, FRACTURES): Vit D, 25-Hydroxy: 37.6 ng/mL (ref 30.0–100.0)

## 2018-09-21 LAB — COMPREHENSIVE METABOLIC PANEL
ALT: 24 IU/L (ref 0–32)
AST: 22 IU/L (ref 0–40)
Albumin/Globulin Ratio: 1.8 (ref 1.2–2.2)
Albumin: 4.2 g/dL (ref 3.8–4.9)
Alkaline Phosphatase: 51 IU/L (ref 39–117)
BUN/Creatinine Ratio: 14 (ref 9–23)
BUN: 12 mg/dL (ref 6–24)
Bilirubin Total: 0.2 mg/dL (ref 0.0–1.2)
CO2: 24 mmol/L (ref 20–29)
Calcium: 9.5 mg/dL (ref 8.7–10.2)
Chloride: 103 mmol/L (ref 96–106)
Creatinine, Ser: 0.88 mg/dL (ref 0.57–1.00)
GFR calc Af Amer: 84 mL/min/{1.73_m2} (ref >59)
GFR calc non Af Amer: 73 mL/min/{1.73_m2} (ref >59)
Globulin, Total: 2.3 g/dL (ref 1.5–4.5)
Glucose: 91 mg/dL (ref 65–99)
Potassium: 4.2 mmol/L (ref 3.5–5.2)
Sodium: 140 mmol/L (ref 134–144)
Total Protein: 6.5 g/dL (ref 6.0–8.5)

## 2018-09-21 LAB — THYROID PANEL WITH TSH
Free Thyroxine Index: 1.4 (ref 1.2–4.9)
T3 Uptake Ratio: 23 % — ABNORMAL LOW (ref 24–39)
T4, Total: 6.1 ug/dL (ref 4.5–12.0)
TSH: 2.46 u[IU]/mL (ref 0.450–4.500)

## 2018-09-21 LAB — HEMOGLOBIN A1C
Est. average glucose Bld gHb Est-mCnc: 117 mg/dL
Hgb A1c MFr Bld: 5.7 % — ABNORMAL HIGH (ref 4.8–5.6)

## 2018-12-25 ENCOUNTER — Other Ambulatory Visit
Admission: RE | Admit: 2018-12-25 | Discharge: 2018-12-25 | Disposition: A | Discharge status: Home / Self Care | Payer: 59 | Source: Ambulatory Visit | Attending: Gastroenterology | Admitting: Gastroenterology

## 2018-12-25 ENCOUNTER — Ambulatory Visit
Admission: RE | Admit: 2018-12-25 | Discharge: 2018-12-25 | Disposition: A | Discharge status: Home / Self Care | Payer: 59 | Source: Ambulatory Visit | Attending: Obstetrics and Gynecology | Admitting: Obstetrics and Gynecology

## 2018-12-25 ENCOUNTER — Other Ambulatory Visit: Payer: Self-pay

## 2018-12-25 DIAGNOSIS — Z01419 Encounter for gynecological examination (general) (routine) without abnormal findings: Secondary | ICD-10-CM

## 2018-12-25 LAB — SARS CORONAVIRUS 2 (TAT 6-24 HRS): SARS Coronavirus 2: NEGATIVE

## 2018-12-27 ENCOUNTER — Encounter: Payer: Self-pay | Admitting: *Deleted

## 2018-12-28 ENCOUNTER — Ambulatory Visit
Admission: RE | Admit: 2018-12-28 | Discharge: 2018-12-28 | Disposition: A | Discharge status: Home / Self Care | Payer: 59 | Attending: Gastroenterology | Admitting: Gastroenterology

## 2018-12-28 ENCOUNTER — Encounter
Admission: RE | Disposition: A | Discharge status: Home / Self Care | Payer: Self-pay | Source: Home / Self Care | Attending: Gastroenterology

## 2018-12-28 ENCOUNTER — Ambulatory Visit: Payer: 59 | Admitting: Anesthesiology

## 2018-12-28 ENCOUNTER — Other Ambulatory Visit: Payer: Self-pay

## 2018-12-28 DIAGNOSIS — Z7982 Long term (current) use of aspirin: Secondary | ICD-10-CM | POA: Diagnosis not present

## 2018-12-28 DIAGNOSIS — Z8541 Personal history of malignant neoplasm of cervix uteri: Secondary | ICD-10-CM | POA: Insufficient documentation

## 2018-12-28 DIAGNOSIS — Z91040 Latex allergy status: Secondary | ICD-10-CM | POA: Diagnosis not present

## 2018-12-28 DIAGNOSIS — Z79899 Other long term (current) drug therapy: Secondary | ICD-10-CM | POA: Insufficient documentation

## 2018-12-28 DIAGNOSIS — K5730 Diverticulosis of large intestine without perforation or abscess without bleeding: Secondary | ICD-10-CM | POA: Diagnosis not present

## 2018-12-28 DIAGNOSIS — Z1211 Encounter for screening for malignant neoplasm of colon: Secondary | ICD-10-CM | POA: Diagnosis not present

## 2018-12-28 DIAGNOSIS — Z86010 Personal history of colonic polyps: Secondary | ICD-10-CM | POA: Diagnosis present

## 2018-12-28 DIAGNOSIS — K219 Gastro-esophageal reflux disease without esophagitis: Secondary | ICD-10-CM | POA: Insufficient documentation

## 2018-12-28 DIAGNOSIS — Z87891 Personal history of nicotine dependence: Secondary | ICD-10-CM | POA: Insufficient documentation

## 2018-12-28 HISTORY — DX: Restless legs syndrome: G25.81

## 2018-12-28 HISTORY — DX: Other complications of anesthesia, initial encounter: T88.59XA

## 2018-12-28 HISTORY — PX: COLONOSCOPY WITH PROPOFOL: SHX5780

## 2018-12-28 SURGERY — COLONOSCOPY WITH PROPOFOL
Anesthesia: General

## 2018-12-28 MED ORDER — PROPOFOL 10 MG/ML IV BOLUS
INTRAVENOUS | Status: DC | PRN
  Administered 2018-12-28: 10 mg via INTRAVENOUS
  Administered 2018-12-28: 90 mg via INTRAVENOUS

## 2018-12-28 MED ORDER — PROPOFOL 500 MG/50ML IV EMUL
INTRAVENOUS | Status: DC | PRN
  Administered 2018-12-28: 150 ug/kg/min via INTRAVENOUS

## 2018-12-28 MED ORDER — LIDOCAINE 2% (20 MG/ML) 5 ML SYRINGE
INTRAMUSCULAR | Status: DC | PRN
  Administered 2018-12-28: 50 mg via INTRAVENOUS

## 2018-12-28 MED ORDER — SODIUM CHLORIDE 0.9 % IV SOLN
INTRAVENOUS | Status: DC
  Administered 2018-12-28: 10:00:00 via INTRAVENOUS

## 2018-12-28 MED ORDER — PROPOFOL 500 MG/50ML IV EMUL
INTRAVENOUS | Status: AC
  Filled 2018-12-28: qty 50

## 2018-12-28 NOTE — Op Note (Signed)
[facility] Gastroenterology Patient Name: Kristin Brown Procedure Date: 12/28/2018 9:59 AM MRN: [id number] Account #: [id number] Date of Birth: 23-Jun-1960 Admit Type: Outpatient Age: 59 Room: [facility] ENDO ROOM 3 Gender: Female Note Status: Finalized Procedure:            Colonoscopy Indications:          Personal history of colonic polyps Providers:            Lollie Sails, MD Medicines:            Monitored Anesthesia Care Complications:        No immediate complications. Procedure:            Pre-Anesthesia Assessment:                       - ASA Grade Assessment: II - A patient with mild                        systemic disease.                       After obtaining informed consent, the colonoscope was                        passed under direct vision. Throughout the procedure,                        the patient's blood pressure, pulse, and oxygen                        saturations were monitored continuously. The was                        introduced through the anus and advanced to the the                        cecum, identified by appendiceal orifice and ileocecal                        valve. The colonoscopy was performed without                        difficulty. The patient tolerated the procedure well.                        The quality of the bowel preparation was good except                        the ascending colon was poor requiring lavage. Findings:      Multiple small to medium-mouthed diverticula were found in the sigmoid       colon, descending colon, transverse colon and ascending colon.      The exam was otherwise without abnormality.      The digital rectal exam was normal. Impression:           - Diverticulosis in the sigmoid colon, in the                        descending colon, in the transverse colon and in the  ascending colon.                       - The examination was otherwise normal.    - No specimens collected. Recommendation:       - Discharge patient to home.                       - Repeat colonoscopy in 5 years for adenoma                        surveillance. Procedure Code(s):    --- Professional ---                       [phone], Colonoscopy, flexible; diagnostic, including                        collection of specimen(s) by brushing or washing, when                        performed (separate procedure) CPT copyright 2019 [employer]. All rights reserved. The codes documented in this report are preliminary and upon coder review may  be revised to meet current compliance requirements. Lollie Sails, MD 12/28/2018 10:42:13 AM This report has been signed electronically. Number of Addenda: 0 Note Initiated On: 12/28/2018 9:59 AM Scope Withdrawal Time: 0 hours 10 minutes 3 seconds  Total Procedure Duration: 0 hours 19 minutes 39 seconds       [facility]

## 2018-12-28 NOTE — Anesthesia Post-op Follow-up Note (Signed)
Anesthesia QCDR form completed.        

## 2018-12-28 NOTE — H&P (Signed)
Outpatient short stay form Pre-procedure 12/28/2018 10:12 AM Lollie Sails MD  Primary Physician: Dr. Maryland Pink  Reason for visit: Colonoscopy  History of present illness: Patient is a 58 year old female presenting today for colonoscopy in regards to her personal history of adenomatous colon polyps.  Her last colonoscopy was 01/27/2012.  She tolerated prep well.  She takes no aspirin or blood thinning agent.  She does have a latex allergy.    Current Facility-Administered Medications:  .  0.9 %  sodium chloride infusion, , Intravenous, Continuous, Lollie Sails, MD  Medications Prior to Admission  Medication Sig Dispense Refill Last Dose  . Cholecalciferol (VITAMIN D3) 1.25 MG (50000 UT) TABS Take by mouth.   Past Week at Unknown time  . clobetasol ointment (TEMOVATE) 0.05 % as needed.    Past Month at Unknown time  . EUCRISA 2 % OINT Apply topically as needed. to affected area  2 Past Month at Unknown time  . ferrous gluconate (IRON 27) 240 (27 FE) MG tablet Take 240 mg by mouth 3 (three) times daily with meals.   Past Week at Unknown time  . Multiple Vitamin (MULTIVITAMIN) capsule Take by mouth.   Past Week at Unknown time  . aspirin EC 81 MG tablet Take by mouth.   12/16/2018  . FLUoxetine (PROZAC) 20 MG capsule TAKE 1 CAPSULE BY MOUTH ONCE DAILY 30 capsule 6 12/25/2018  . gabapentin (NEURONTIN) 100 MG capsule Take 300 mg at night.   12/26/2018  . naproxen sodium (ALEVE) 220 MG tablet Take 220 mg by mouth.   12/18/2018  . pantoprazole (PROTONIX) 40 MG tablet Take by mouth.   12/26/2018     Allergies  Allergen Reactions  . Latex Rash     Past Medical History:  Diagnosis Date  . Cervical cancer ([place]) 2003  . Complication of anesthesia    med given lowered BP  . GERD (gastroesophageal reflux disease)   . Restless leg syndrome     Review of systems:      Physical Exam    Heart and lungs: Regular rate and rhythm without rub or gallop lungs are bilaterally clear   HEENT: Normocephalic atraumatic eyes are anicteric    Other:    Pertinant exam for procedure: Soft nontender nondistended bowel sounds positive normoactive    Planned proceedures: Colonoscopy and indicated procedures. I have discussed the risks benefits and complications of procedures to include not limited to bleeding, infection, perforation and the risk of sedation and the patient wishes to proceed.    Lollie Sails, MD Gastroenterology 12/28/2018  10:12 AM

## 2018-12-28 NOTE — Transfer of Care (Signed)
Immediate Anesthesia Transfer of Care Note  Patient: Kristin Brown  Procedure(s) Performed: COLONOSCOPY WITH PROPOFOL (N/A )  Patient Location: Endoscopy Unit  Anesthesia Type:General  Level of Consciousness: sedated  Airway & Oxygen Therapy: Patient connected to nasal cannula oxygen  Post-op Assessment: Post -op Vital signs reviewed and stable  Post vital signs: stable  Last Vitals:  Vitals Value Taken Time  BP 99/64 12/28/18 1042  Temp    Pulse 80 12/28/18 1042  Resp 12 12/28/18 1042  SpO2 97 % 12/28/18 1042  Vitals shown include unvalidated device data.  Last Pain:  Vitals:   12/28/18 1042  TempSrc:   PainSc: 0-No pain         Complications: No apparent anesthesia complications

## 2018-12-28 NOTE — Anesthesia Preprocedure Evaluation (Signed)
Anesthesia Evaluation  Patient identified by MRN, date of birth, ID band Patient awake    Reviewed: Allergy & Precautions, H&P , NPO status , Patient's Chart, lab work & pertinent test results, reviewed documented beta blocker date and time   Airway Mallampati: II   Neck ROM: full    Dental  (+) Poor Dentition   Pulmonary neg pulmonary ROS, former smoker,    Pulmonary exam normal        Cardiovascular Exercise Tolerance: Good negative cardio ROS Normal cardiovascular exam Rhythm:regular Rate:Normal     Neuro/Psych negative neurological ROS  negative psych ROS   GI/Hepatic negative GI ROS, Neg liver ROS, GERD  ,  Endo/Other  negative endocrine ROS  Renal/GU negative Renal ROS  negative genitourinary   Musculoskeletal   Abdominal   Peds  Hematology negative hematology ROS (+)   Anesthesia Other Findings Past Medical History: 2003: Cervical cancer ([place]) No date: GERD (gastroesophageal reflux disease) No date: Restless leg syndrome Past Surgical History: 2003: ABDOMINAL HYSTERECTOMY No date: COLONOSCOPY WITH ESOPHAGOGASTRODUODENOSCOPY (EGD) AND  ESOPHAGEAL DILATION (ED)   Reproductive/Obstetrics negative OB ROS                             Anesthesia Physical Anesthesia Plan  ASA: II  Anesthesia Plan: General   Post-op Pain Management:    Induction:   PONV Risk Score and Plan:   Airway Management Planned:   Additional Equipment:   Intra-op Plan:   Post-operative Plan:   Informed Consent: I have reviewed the patients History and Physical, chart, labs and discussed the procedure including the risks, benefits and alternatives for the proposed anesthesia with the patient or authorized representative who has indicated his/her understanding and acceptance.     Dental Advisory Given  Plan Discussed with: CRNA  Anesthesia Plan Comments:         Anesthesia Quick  Evaluation

## 2019-01-01 NOTE — Anesthesia Postprocedure Evaluation (Signed)
Anesthesia Post Note  Patient: Kristin Brown [facility]  Procedure(s) Performed: COLONOSCOPY WITH PROPOFOL (N/A )  Patient location during evaluation: PACU Anesthesia Type: General Level of consciousness: awake and alert Pain management: pain level controlled Vital Signs Assessment: post-procedure vital signs reviewed and stable Respiratory status: spontaneous breathing, nonlabored ventilation, respiratory function stable and patient connected to nasal cannula oxygen Cardiovascular status: blood pressure returned to baseline and stable Postop Assessment: no apparent nausea or vomiting Anesthetic complications: no     Last Vitals:  Vitals:   12/28/18 1052 12/28/18 1102  BP: 126/81 (!) 141/78  Pulse: 73 67  Resp: 15 12  Temp:    SpO2: 99% 99%    Last Pain:  Vitals:   12/28/18 1102  TempSrc:   PainSc: 0-No pain                 Molli Barrows

## 2019-07-04 ENCOUNTER — Other Ambulatory Visit: Payer: Self-pay | Admitting: Neurology

## 2019-07-04 DIAGNOSIS — G8929 Other chronic pain: Secondary | ICD-10-CM

## 2019-07-08 ENCOUNTER — Other Ambulatory Visit: Payer: Self-pay

## 2019-07-08 ENCOUNTER — Ambulatory Visit
Admission: RE | Admit: 2019-07-08 | Discharge: 2019-07-08 | Disposition: A | Discharge status: Home / Self Care | Payer: 59 | Source: Ambulatory Visit | Attending: Neurology | Admitting: Neurology

## 2019-07-08 DIAGNOSIS — M545 Low back pain, unspecified: Secondary | ICD-10-CM

## 2019-07-08 DIAGNOSIS — G8929 Other chronic pain: Secondary | ICD-10-CM | POA: Insufficient documentation

## 2019-09-26 ENCOUNTER — Other Ambulatory Visit: Payer: Self-pay | Admitting: Obstetrics and Gynecology

## 2019-09-26 DIAGNOSIS — Z1231 Encounter for screening mammogram for malignant neoplasm of breast: Secondary | ICD-10-CM

## 2019-12-26 ENCOUNTER — Ambulatory Visit
Admission: RE | Admit: 2019-12-26 | Discharge: 2019-12-26 | Disposition: A | Discharge status: Home / Self Care | Payer: 59 | Source: Ambulatory Visit | Attending: Obstetrics and Gynecology | Admitting: Obstetrics and Gynecology

## 2019-12-26 DIAGNOSIS — Z1231 Encounter for screening mammogram for malignant neoplasm of breast: Secondary | ICD-10-CM | POA: Insufficient documentation

## 2020-07-14 ENCOUNTER — Ambulatory Visit: Payer: 59 | Admitting: Podiatry

## 2020-07-14 ENCOUNTER — Other Ambulatory Visit: Payer: Self-pay

## 2020-07-14 DIAGNOSIS — B351 Tinea unguium: Secondary | ICD-10-CM

## 2020-07-14 DIAGNOSIS — M79674 Pain in right toe(s): Secondary | ICD-10-CM | POA: Diagnosis not present

## 2020-07-14 MED ORDER — TERBINAFINE HCL 250 MG PO TABS: 250.0000 mg | ORAL_TABLET | Freq: Every day | ORAL | 0 refills | Status: AC

## 2020-07-14 NOTE — Progress Notes (Signed)
   Subjective: 60 y.o. female presenting today for evaluation of a symptomatic toenail to the right great toe that's been going on for the past 6 months.  Patient has noticed discoloration and she states that the nail is possibly lifting off of the underlying nail bed.  It is very sensitive with palpation.  She presents for further treatment evaluation.  She denies any history of injury  Past Medical History:  Diagnosis Date  . Cervical cancer ([place]) 2003  . Complication of anesthesia    med given lowered BP  . GERD (gastroesophageal reflux disease)   . Restless leg syndrome     Objective: Physical Exam General: The patient is alert and oriented x3 in no acute distress.  Dermatology: Hyperkeratotic, discolored, thickened, onychodystrophy noted right hallux. Skin is warm, dry and supple bilateral lower extremities. Negative for open lesions or macerations.  Vascular: Palpable pedal pulses bilaterally. No edema or erythema noted. Capillary refill within normal limits.  Neurological: Epicritic and protective threshold grossly intact bilaterally.   Musculoskeletal Exam: Range of motion within normal limits to all pedal and ankle joints bilateral. Muscle strength 5/5 in all groups bilateral.   Assessment: #1 Onychomycosis of toenail right hallux #2 Hyperkeratotic nails right hallux  Plan of Care:  #1 Patient was evaluated. #2  Today we discussed different treatment options including topical, oral, and laser antifungal treatment modalities.  Patient opts for topical and oral. #3 prescription for Lamisil 250 mg #90 daily.  She denies a history of hepatic symptoms or pathology #4 OTC formula 3 antifungal topical was also provided for the patient #5 return to clinic as needed   Edrick Kins, DPM [facility]  Dr. Edrick Kins, DPM    [address], [place] [address]                Office [phone]  Fax [phone]

## 2020-07-14 NOTE — Patient Instructions (Signed)

## 2020-12-29 ENCOUNTER — Other Ambulatory Visit: Payer: Self-pay | Admitting: Family Medicine

## 2020-12-29 DIAGNOSIS — Z1231 Encounter for screening mammogram for malignant neoplasm of breast: Secondary | ICD-10-CM

## 2021-01-07 ENCOUNTER — Other Ambulatory Visit: Payer: Self-pay

## 2021-01-07 ENCOUNTER — Ambulatory Visit
Admission: RE | Admit: 2021-01-07 | Discharge: 2021-01-07 | Disposition: A | Discharge status: Home / Self Care | Payer: 59 | Source: Ambulatory Visit | Attending: Family Medicine | Admitting: Family Medicine

## 2021-01-07 DIAGNOSIS — Z1231 Encounter for screening mammogram for malignant neoplasm of breast: Secondary | ICD-10-CM | POA: Diagnosis not present

## 2021-12-01 ENCOUNTER — Other Ambulatory Visit: Payer: Self-pay | Admitting: Obstetrics and Gynecology

## 2021-12-01 DIAGNOSIS — Z1231 Encounter for screening mammogram for malignant neoplasm of breast: Secondary | ICD-10-CM

## 2022-07-15 ENCOUNTER — Ambulatory Visit
Admission: RE | Admit: 2022-07-15 | Discharge: 2022-07-15 | Disposition: A | Discharge status: Home / Self Care | Payer: 59 | Source: Ambulatory Visit | Attending: Obstetrics and Gynecology | Admitting: Obstetrics and Gynecology

## 2022-07-15 DIAGNOSIS — Z1231 Encounter for screening mammogram for malignant neoplasm of breast: Secondary | ICD-10-CM | POA: Insufficient documentation

## 2023-08-10 ENCOUNTER — Ambulatory Visit
Admission: RE | Admit: 2023-08-10 | Discharge: 2023-08-10 | Disposition: A | Discharge status: Home / Self Care | Payer: Self-pay | Source: Ambulatory Visit | Attending: Family Medicine | Admitting: Family Medicine

## 2023-08-10 ENCOUNTER — Other Ambulatory Visit: Payer: Self-pay | Admitting: Family Medicine

## 2023-08-10 DIAGNOSIS — Z8249 Family history of ischemic heart disease and other diseases of the circulatory system: Secondary | ICD-10-CM | POA: Insufficient documentation

## 2024-01-23 ENCOUNTER — Other Ambulatory Visit: Payer: Self-pay | Admitting: Obstetrics and Gynecology

## 2024-01-23 DIAGNOSIS — Z1231 Encounter for screening mammogram for malignant neoplasm of breast: Secondary | ICD-10-CM

## 2024-02-21 ENCOUNTER — Ambulatory Visit
Admission: RE | Admit: 2024-02-21 | Discharge: 2024-02-21 | Disposition: A | Discharge status: Home / Self Care | Source: Ambulatory Visit | Attending: Obstetrics and Gynecology | Admitting: Obstetrics and Gynecology

## 2024-02-21 DIAGNOSIS — Z1231 Encounter for screening mammogram for malignant neoplasm of breast: Secondary | ICD-10-CM | POA: Diagnosis present

## 2024-04-08 ENCOUNTER — Ambulatory Visit
Admission: RE | Admit: 2024-04-08 | Discharge: 2024-04-08 | Disposition: A | Discharge status: Home / Self Care | Attending: Gastroenterology | Admitting: Gastroenterology

## 2024-04-08 ENCOUNTER — Encounter: Admission: RE | Disposition: A | Payer: Self-pay | Attending: Gastroenterology

## 2024-04-08 ENCOUNTER — Other Ambulatory Visit: Payer: Self-pay

## 2024-04-08 ENCOUNTER — Ambulatory Visit

## 2024-04-08 DIAGNOSIS — Z8541 Personal history of malignant neoplasm of cervix uteri: Secondary | ICD-10-CM | POA: Insufficient documentation

## 2024-04-08 DIAGNOSIS — K640 First degree hemorrhoids: Secondary | ICD-10-CM | POA: Diagnosis not present

## 2024-04-08 DIAGNOSIS — K5520 Angiodysplasia of colon without hemorrhage: Secondary | ICD-10-CM | POA: Insufficient documentation

## 2024-04-08 DIAGNOSIS — Z1211 Encounter for screening for malignant neoplasm of colon: Secondary | ICD-10-CM | POA: Diagnosis present

## 2024-04-08 DIAGNOSIS — D124 Benign neoplasm of descending colon: Secondary | ICD-10-CM | POA: Diagnosis not present

## 2024-04-08 DIAGNOSIS — K219 Gastro-esophageal reflux disease without esophagitis: Secondary | ICD-10-CM | POA: Insufficient documentation

## 2024-04-08 HISTORY — PX: POLYPECTOMY: SHX149

## 2024-04-08 HISTORY — PX: COLONOSCOPY: SHX5424

## 2024-04-08 SURGERY — COLONOSCOPY
Anesthesia: General

## 2024-04-08 MED ORDER — LIDOCAINE HCL (CARDIAC) PF 100 MG/5ML IV SOSY
PREFILLED_SYRINGE | INTRAVENOUS | Status: DC | PRN
  Administered 2024-04-08: 50 mg via INTRAVENOUS

## 2024-04-08 MED ORDER — SODIUM CHLORIDE 0.9 % IV SOLN: INTRAVENOUS | Status: DC

## 2024-04-08 MED ORDER — PROPOFOL 10 MG/ML IV BOLUS
INTRAVENOUS | Status: DC | PRN
  Administered 2024-04-08: 100 ug/kg/min via INTRAVENOUS
  Administered 2024-04-08: 50 mg via INTRAVENOUS

## 2024-04-08 MED ORDER — PROPOFOL 10 MG/ML IV BOLUS
INTRAVENOUS | Status: AC
  Filled 2024-04-08: qty 20

## 2024-04-08 MED ORDER — LIDOCAINE HCL (PF) 2 % IJ SOLN
INTRAMUSCULAR | Status: AC
  Filled 2024-04-08: qty 5

## 2024-04-08 NOTE — Anesthesia Postprocedure Evaluation (Signed)
"   Anesthesia Post Note  Patient: Kristin Brown [facility]  Procedure(s) Performed: COLONOSCOPY POLYPECTOMY, INTESTINE  Anesthesia Type: General Anesthetic complications: no   No notable events documented.   Last Vitals:  Vitals:   04/08/24 0946 04/08/24 0955  BP: 111/63 123/65  Pulse: 66 60  Resp: 15 11  Temp: (!) 35.9 C (!) 35.9 C  SpO2: 100% 100%    Last Pain:  Vitals:   04/08/24 0955  TempSrc: Temporal  PainSc: 0-No pain                 Karna BIRCH Cori Henningsen      "

## 2024-04-08 NOTE — Transfer of Care (Signed)
 Immediate Anesthesia Transfer of Care Note  Patient: Kristin Brown [facility]  Procedure(s) Performed: COLONOSCOPY POLYPECTOMY, INTESTINE  Patient Location: PACU and Endoscopy Unit  Anesthesia Type:General  Level of Consciousness: awake  Airway & Oxygen Therapy: Patient Spontanous Breathing  Post-op Assessment: Report given to RN  Post vital signs: stable  Last Vitals:  Vitals Value Taken Time  BP    Temp    Pulse    Resp    SpO2      Last Pain:  Vitals:   04/08/24 0815  TempSrc: Temporal  PainSc: 0-No pain         Complications: No notable events documented.

## 2024-04-08 NOTE — Anesthesia Preprocedure Evaluation (Signed)
"                                    Anesthesia Evaluation  Patient identified by MRN, date of birth, ID band Patient awake    Reviewed: Allergy & Precautions, NPO status , Patient's Chart, lab work & pertinent test results  Airway Mallampati: III  TM Distance: <3 FB Neck ROM: full    Dental  (+) Teeth Intact   Pulmonary neg pulmonary ROS, Patient abstained from smoking., former smoker   Pulmonary exam normal        Cardiovascular Exercise Tolerance: Good negative cardio ROS Normal cardiovascular exam Rhythm:Regular Rate:Normal     Neuro/Psych negative neurological ROS  negative psych ROS   GI/Hepatic negative GI ROS, Neg liver ROS,GERD  Medicated,,  Endo/Other  negative endocrine ROS    Renal/GU negative Renal ROS  negative genitourinary   Musculoskeletal   Abdominal  (+) + obese  Peds negative pediatric ROS (+)  Hematology negative hematology ROS (+)   Anesthesia Other Findings Past Medical History: 2003: Cervical cancer ([facility]) No date: Complication of anesthesia     Comment:  med given lowered BP No date: GERD (gastroesophageal reflux disease) No date: Restless leg syndrome  Past Surgical History: 2003: ABDOMINAL HYSTERECTOMY No date: COLONOSCOPY WITH ESOPHAGOGASTRODUODENOSCOPY (EGD) AND  ESOPHAGEAL DILATION (ED) 12/28/2018: COLONOSCOPY WITH PROPOFOL ; N/A     Comment:  Procedure: COLONOSCOPY WITH PROPOFOL ;  Surgeon:               Gaylyn Gladis PENNER, MD;  Location: [facility] ENDOSCOPY;                Service: Endoscopy;  Laterality: N/A;  BMI    Body Mass Index: 33.94 kg/m      Reproductive/Obstetrics negative OB ROS                              Anesthesia Physical Anesthesia Plan  ASA: 2  Anesthesia Plan: General   Post-op Pain Management:    Induction: Intravenous  PONV Risk Score and Plan: Propofol  infusion and TIVA  Airway Management Planned: Natural Airway and Nasal Cannula  Additional Equipment:    Intra-op Plan:   Post-operative Plan:   Informed Consent: I have reviewed the patients History and Physical, chart, labs and discussed the procedure including the risks, benefits and alternatives for the proposed anesthesia with the patient or authorized representative who has indicated his/her understanding and acceptance.     Dental Advisory Given  Plan Discussed with: CRNA  Anesthesia Plan Comments:         Anesthesia Quick Evaluation  "

## 2024-04-08 NOTE — Interval H&P Note (Signed)
 History and Physical Interval Note:  04/08/2024 9:06 AM  Kristin Brown  has presented today for surgery, with the diagnosis of History of adenomatous polyp of colon (Z86.0101).  The various methods of treatment have been discussed with the patient and family. After consideration of risks, benefits and other options for treatment, the patient has consented to  Procedures: COLONOSCOPY (N/A) as a surgical intervention.  The patient's history has been reviewed, patient examined, no change in status, stable for surgery.  I have reviewed the patient's chart and labs.  Questions were answered to the patient's satisfaction.     [name]  Ok to proceed with colonoscopy

## 2024-04-08 NOTE — H&P (Signed)
 Outpatient short stay form Pre-procedure 04/08/2024  Kristin ONEIDA Schick, MD  Primary Physician: Valora Lynwood FALCON, MD  Reason for visit:  Surveillance  History of present illness:    63 y/o lady with history of cervical cancer and GERD here for surveillance colonoscopy for history of polyps. Last colonoscopy in 2020 was normal. No blood thinners. No family history of GI malignancies. No significant abdominal surgeries.   Current Medications[1]  Medications Prior to Admission  Medication Sig Dispense Refill Last Dose/Taking   naproxen sodium (ALEVE) 220 MG tablet Take 220 mg by mouth.   Past Month   tirzepatide (ZEPBOUND) 2.5 MG/0.5ML Pen Inject 2.5 mg into the skin once a week.   03/12/2024   aspirin EC 81 MG tablet Take by mouth.   04/05/2024   Cholecalciferol (VITAMIN D3) 1.25 MG (50000 UT) TABS Take by mouth.   04/05/2024   clobetasol ointment (TEMOVATE) 0.05 % as needed.       EUCRISA 2 % OINT Apply topically as needed. to affected area  2    ferrous gluconate (IRON 27) 240 (27 FE) MG tablet Take 240 mg by mouth 3 (three) times daily with meals.   04/05/2024   FLUoxetine  (PROZAC ) 20 MG capsule TAKE 1 CAPSULE BY MOUTH ONCE DAILY 30 capsule 6 03/20/2024   gabapentin (NEURONTIN) 100 MG capsule Take 300 mg at night.   04/05/2024   Multiple Vitamin (MULTIVITAMIN) capsule Take by mouth.   04/05/2024   pantoprazole (PROTONIX) 40 MG tablet Take by mouth.   04/05/2024   terbinafine  (LAMISIL ) 250 MG tablet Take 1 tablet (250 mg total) by mouth daily. (Patient not taking: Reported on 04/08/2024) 90 tablet 0 Completed Course     Allergies[2]   Past Medical History:  Diagnosis Date   Cervical cancer ([facility]) 2003   Complication of anesthesia    med given lowered BP   GERD (gastroesophageal reflux disease)    Restless leg syndrome     Review of systems:  Otherwise negative.    Physical Exam  Gen: Alert, oriented. Appears stated age.  HEENT: PERRLA. Lungs: No respiratory  distress CV: RRR Abd: soft, benign, no masses Ext: No edema    Planned procedures: Proceed with colonoscopy. The patient understands the nature of the planned procedure, indications, risks, alternatives and potential complications including but not limited to bleeding, infection, perforation, damage to internal organs and possible oversedation/side effects from anesthesia. The patient agrees and gives consent to proceed.  Please refer to procedure notes for findings, recommendations and patient disposition/instructions.     Kristin ONEIDA Schick, MD Kristin Brown [facility]         [1]  Current Facility-Administered Medications:    0.9 %  sodium chloride  infusion, , Intravenous, Continuous, Shenee Wignall, Kristin ONEIDA, MD, Last Rate: 20 mL/hr at 04/08/24 0825, New Bag at 04/08/24 0825 [2]  Allergies Allergen Reactions   Latex Rash

## 2024-04-08 NOTE — Op Note (Signed)
 [facility] Patient Name: Kristin Brown Procedure Date: 04/08/2024 9:10 AM MRN: [id number] Account #: [id number] Date of Birth: 1961-03-19 Admit Type: Outpatient Age: 63 Room: [facility] ENDO ROOM 3 Gender: Female Note Status: Finalized Instrument Name: Colon Scope [id number] Procedure:             Colonoscopy Indications:           High risk colon cancer surveillance: Personal history                         of colonic polyps, Last colonoscopy 5 years ago Providers:             Ole Schick MD, MD Referring MD:          Andriette Null (Referring MD) Medicines:             Monitored Anesthesia Care Complications:         No immediate complications. Estimated blood loss:                         Minimal. Procedure:             Pre-Anesthesia Assessment:                        - Prior to the procedure, a History and Physical was                         performed, and patient medications and allergies were                         reviewed. The patient is competent. The risks and                         benefits of the procedure and the sedation options and                         risks were discussed with the patient. All questions                         were answered and informed consent was obtained.                         Patient identification and proposed procedure were                         verified by the physician, the nurse, the                         anesthesiologist, the anesthetist and the technician                         in the endoscopy suite. Mental Status Examination:                         alert and oriented. Airway Examination: normal                         oropharyngeal airway and neck mobility. Respiratory  Examination: clear to auscultation. CV Examination:                         normal. Prophylactic Antibiotics: The patient does not                         require prophylactic antibiotics. Prior                          Anticoagulants: The patient has taken no anticoagulant                         or antiplatelet agents. ASA Grade Assessment: II - A                         patient with mild systemic disease. After reviewing                         the risks and benefits, the patient was deemed in                         satisfactory condition to undergo the procedure. The                         anesthesia plan was to use monitored anesthesia care                         (MAC). Immediately prior to administration of                         medications, the patient was re-assessed for adequacy                         to receive sedatives. The heart rate, respiratory                         rate, oxygen saturations, blood pressure, adequacy of                         pulmonary ventilation, and response to care were                         monitored throughout the procedure. The physical                         status of the patient was re-assessed after the                         procedure.                        After obtaining informed consent, the colonoscope was                         passed under direct vision. Throughout the procedure,                         the patient's blood pressure, pulse, and oxygen  saturations were monitored continuously. The was                         introduced through the anus and advanced to the the                         terminal ileum, with identification of the appendiceal                         orifice and IC valve. The colonoscopy was performed                         without difficulty. The patient tolerated the                         procedure well. The quality of the bowel preparation                         was good. The terminal ileum, ileocecal valve,                         appendiceal orifice, and rectum were photographed. Findings:      The perianal and digital rectal examinations were normal.      The terminal  ileum appeared normal.      A single small localized angioectasia without bleeding was found in the       cecum.      Two sessile polyps were found in the descending colon. The polyps were 2       to 3 mm in size. These polyps were removed with a cold snare. Resection       and retrieval were complete. Estimated blood loss was minimal.      Internal hemorrhoids were found during retroflexion. The hemorrhoids       were Grade I (internal hemorrhoids that do not prolapse).      The exam was otherwise without abnormality on direct and retroflexion       views. Impression:            - The examined portion of the ileum was normal.                        - A single non-bleeding colonic angioectasia.                        - Two 2 to 3 mm polyps in the descending colon,                         removed with a cold snare. Resected and retrieved.                        - Internal hemorrhoids.                        - The examination was otherwise normal on direct and                         retroflexion views. Recommendation:        - Discharge patient to home.                        -  Resume previous diet.                        - Continue present medications.                        - Await pathology results.                        - Repeat colonoscopy in 5 years for surveillance.                        - Return to referring physician as previously                         scheduled. Procedure Code(s):     --- Professional ---                        [phone], Colonoscopy, flexible; with removal of                         tumor(s), polyp(s), or other lesion(s) by snare                         technique Diagnosis Code(s):     --- Professional ---                        Z86.010, Personal history of colonic polyps                        K64.0, First degree hemorrhoids                        K55.20, Angiodysplasia of colon without hemorrhage                        D12.4, Benign neoplasm of descending  colon CPT copyright 2022 [employer]. All rights reserved. The codes documented in this report are preliminary and upon coder review may  be revised to meet current compliance requirements. Ole Schick MD, MD 04/08/2024 9:35:38 AM Number of Addenda: 0 Note Initiated On: 04/08/2024 9:10 AM Scope Withdrawal Time: 0 hours 10 minutes 21 seconds  Total Procedure Duration: 0 hours 15 minutes 15 seconds  Estimated Blood Loss:  Estimated blood loss was minimal.      [facility]

## 2024-04-09 LAB — SURGICAL PATHOLOGY
